# Patient Record
Sex: Female | Born: 1955 | Race: Black or African American | Hispanic: No | Marital: Single | Smoking: Former smoker
Health system: Southern US, Community
[De-identification: ages and names within clinical notes are randomized; demographics above are authoritative.]

## PROBLEM LIST (undated history)

## (undated) DIAGNOSIS — E079 Disorder of thyroid, unspecified: Secondary | ICD-10-CM

## (undated) DIAGNOSIS — E039 Hypothyroidism, unspecified: Secondary | ICD-10-CM

## (undated) DIAGNOSIS — E785 Hyperlipidemia, unspecified: Secondary | ICD-10-CM

## (undated) DIAGNOSIS — I639 Cerebral infarction, unspecified: Secondary | ICD-10-CM

## (undated) DIAGNOSIS — I10 Essential (primary) hypertension: Secondary | ICD-10-CM

## (undated) HISTORY — PX: APPENDECTOMY: SHX54

---

## 2011-04-15 ENCOUNTER — Other Ambulatory Visit (HOSPITAL_COMMUNITY): Payer: Self-pay | Admitting: Endocrinology

## 2011-04-15 DIAGNOSIS — E0590 Thyrotoxicosis, unspecified without thyrotoxic crisis or storm: Secondary | ICD-10-CM

## 2011-04-24 ENCOUNTER — Ambulatory Visit (HOSPITAL_COMMUNITY)
Admission: RE | Admit: 2011-04-24 | Discharge: 2011-04-24 | Disposition: A | Discharge status: Home / Self Care | Payer: Medicaid Other | Source: Ambulatory Visit | Attending: Endocrinology | Admitting: Endocrinology

## 2011-04-24 DIAGNOSIS — E0590 Thyrotoxicosis, unspecified without thyrotoxic crisis or storm: Secondary | ICD-10-CM

## 2011-04-24 MED ORDER — SODIUM IODIDE I 131 CAPSULE
42.0000 | Freq: Once | INTRAVENOUS | Status: AC | PRN
  Administered 2011-04-24: 42 via ORAL

## 2015-07-17 ENCOUNTER — Emergency Department (HOSPITAL_COMMUNITY)
Admission: EM | Admit: 2015-07-17 | Discharge: 2015-07-17 | Disposition: A | Discharge status: Home / Self Care | Payer: Medicare PPO | Attending: Emergency Medicine | Admitting: Emergency Medicine

## 2015-07-17 ENCOUNTER — Encounter (HOSPITAL_COMMUNITY): Payer: Self-pay | Admitting: Family Medicine

## 2015-07-17 ENCOUNTER — Emergency Department (HOSPITAL_COMMUNITY): Payer: Medicare PPO

## 2015-07-17 DIAGNOSIS — R51 Headache: Secondary | ICD-10-CM | POA: Insufficient documentation

## 2015-07-17 DIAGNOSIS — I69398 Other sequelae of cerebral infarction: Secondary | ICD-10-CM | POA: Insufficient documentation

## 2015-07-17 DIAGNOSIS — I10 Essential (primary) hypertension: Secondary | ICD-10-CM | POA: Insufficient documentation

## 2015-07-17 DIAGNOSIS — Z79899 Other long term (current) drug therapy: Secondary | ICD-10-CM | POA: Insufficient documentation

## 2015-07-17 DIAGNOSIS — R4701 Aphasia: Secondary | ICD-10-CM | POA: Diagnosis not present

## 2015-07-17 DIAGNOSIS — Z7901 Long term (current) use of anticoagulants: Secondary | ICD-10-CM | POA: Insufficient documentation

## 2015-07-17 DIAGNOSIS — R519 Headache, unspecified: Secondary | ICD-10-CM

## 2015-07-17 DIAGNOSIS — R200 Anesthesia of skin: Secondary | ICD-10-CM | POA: Diagnosis not present

## 2015-07-17 DIAGNOSIS — M791 Myalgia: Secondary | ICD-10-CM | POA: Insufficient documentation

## 2015-07-17 DIAGNOSIS — M25512 Pain in left shoulder: Secondary | ICD-10-CM | POA: Insufficient documentation

## 2015-07-17 DIAGNOSIS — E079 Disorder of thyroid, unspecified: Secondary | ICD-10-CM | POA: Insufficient documentation

## 2015-07-17 DIAGNOSIS — M25511 Pain in right shoulder: Secondary | ICD-10-CM | POA: Insufficient documentation

## 2015-07-17 DIAGNOSIS — M542 Cervicalgia: Secondary | ICD-10-CM | POA: Insufficient documentation

## 2015-07-17 DIAGNOSIS — R531 Weakness: Secondary | ICD-10-CM | POA: Insufficient documentation

## 2015-07-17 DIAGNOSIS — Z87891 Personal history of nicotine dependence: Secondary | ICD-10-CM | POA: Diagnosis not present

## 2015-07-17 HISTORY — DX: Essential (primary) hypertension: I10

## 2015-07-17 HISTORY — DX: Cerebral infarction, unspecified: I63.9

## 2015-07-17 HISTORY — DX: Disorder of thyroid, unspecified: E07.9

## 2015-07-17 LAB — CBC WITH DIFFERENTIAL/PLATELET
BASOS ABS: 0 10*3/uL (ref 0.0–0.1)
Basophils Relative: 1 %
EOS ABS: 0 10*3/uL (ref 0.0–0.7)
Eosinophils Relative: 0 %
HCT: 37.7 % (ref 36.0–46.0)
HEMOGLOBIN: 12.3 g/dL (ref 12.0–15.0)
LYMPHS ABS: 1.9 10*3/uL (ref 0.7–4.0)
LYMPHS PCT: 41 %
MCH: 31.3 pg (ref 26.0–34.0)
MCHC: 32.6 g/dL (ref 30.0–36.0)
MCV: 95.9 fL (ref 78.0–100.0)
Monocytes Absolute: 0.4 10*3/uL (ref 0.1–1.0)
Monocytes Relative: 9 %
NEUTROS PCT: 49 %
Neutro Abs: 2.4 10*3/uL (ref 1.7–7.7)
PLATELETS: 185 10*3/uL (ref 150–400)
RBC: 3.93 MIL/uL (ref 3.87–5.11)
RDW: 13.2 % (ref 11.5–15.5)
WBC: 4.7 10*3/uL (ref 4.0–10.5)

## 2015-07-17 LAB — COMPREHENSIVE METABOLIC PANEL
ALT: 21 U/L (ref 14–54)
AST: 23 U/L (ref 15–41)
Albumin: 3.9 g/dL (ref 3.5–5.0)
Alkaline Phosphatase: 66 U/L (ref 38–126)
Anion gap: 12 (ref 5–15)
BUN: 6 mg/dL (ref 6–20)
CHLORIDE: 110 mmol/L (ref 101–111)
CO2: 22 mmol/L (ref 22–32)
Calcium: 9.7 mg/dL (ref 8.9–10.3)
Creatinine, Ser: 0.65 mg/dL (ref 0.44–1.00)
Glucose, Bld: 93 mg/dL (ref 65–99)
POTASSIUM: 3.7 mmol/L (ref 3.5–5.1)
SODIUM: 144 mmol/L (ref 135–145)
Total Bilirubin: 0.6 mg/dL (ref 0.3–1.2)
Total Protein: 7.2 g/dL (ref 6.5–8.1)

## 2015-07-17 LAB — PROTIME-INR
INR: 1.11 (ref 0.00–1.49)
PROTHROMBIN TIME: 14.5 s (ref 11.6–15.2)

## 2015-07-17 MED ORDER — METOCLOPRAMIDE HCL 5 MG/ML IJ SOLN
10.0000 mg | Freq: Once | INTRAMUSCULAR | Status: AC
  Administered 2015-07-17: 10 mg via INTRAVENOUS
  Filled 2015-07-17: qty 2

## 2015-07-17 MED ORDER — DIPHENHYDRAMINE HCL 50 MG/ML IJ SOLN
25.0000 mg | Freq: Once | INTRAMUSCULAR | Status: AC
  Administered 2015-07-17: 25 mg via INTRAVENOUS
  Filled 2015-07-17: qty 1

## 2015-07-17 NOTE — ED Notes (Signed)
Pt here for right sided headache x 3 days and pain down her entire right side. Denies fall or injury.

## 2015-07-17 NOTE — ED Notes (Signed)
EDP at bedside  

## 2015-07-17 NOTE — Discharge Instructions (Signed)
Today you were seen for a headache and right sided numbness.  As discussed, we did not see evidence of a new stroke on your Head CT which was obtained today. Your symptoms may be due to increased stress or from infection which you had last week as these can sometimes worsen you stroke symptoms. HOME CARE INSTRUCTIONS  Watch your condition for any changes. Take these steps to help with your condition: Managing Pain  Take over-the-counter and prescription medicines - Take Tylenol or your prescription pain medicine as needed. Avoid Ibuprofen or Naproxen.   Lie down in a dark, quiet room when you have a headache.  Keep lights dim if bright lights bother you or make your headaches worse.  Eating and Drinking  Eat meals on a regular schedule.  Limit alcohol use.  Decrease the amount of caffeine you drink, or stop drinking caffeine.  General Instructions  Please follow up with your primary care doctor or neurologist.  Keep a headache journal to help find out what may trigger your headaches. For example, write down:  What you eat and drink.  How much sleep you get.  Any change to your diet or medicines.  Try massage or other relaxation techniques.  Limit stress.  Sit up straight, and do not tense your muscles.  Do not use tobacco products, including cigarettes, chewing tobacco, or e-cigarettes.   Exercise regularly as told by your health care provider. Continue going to your PT appointments  Sleep on a regular schedule. Get 7-9 hours of sleep, or the amount recommended by your health care provider.  SEEK IMMEDIATE MEDICAL CARE IF:   Your headache becomes severe.  You have repeated vomiting.  You have a stiff neck.  You have a loss of vision.  You have problems with speech.  You have pain in the eye or ear.  You have muscular weakness or loss of muscle control.  You lose your balance or have trouble walking.  You feel faint or pass out.  You have confusion.     This information is not intended to replace advice given to you by your health care provider. Make sure you discuss any questions you have with your health care provider.   Document Released: 04/20/2005 Document Revised: 01/09/2015 Document Reviewed: 08/13/2014 Elsevier Interactive Patient Education [employer]2016 Elsevier Inc.

## 2015-07-17 NOTE — ED Provider Notes (Signed)
CSN: [id number]     Arrival date & time 07/17/15  0941 History   First MD Initiated Contact with Patient 07/17/15 1248     Chief Complaint  Patient presents with  . Headache    HPI Comments: 60 year old female presents with a headache for the past 3 days along with numbness which radiates down the right side of her body. 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 Denies history of HA. She reports her headache is worst in the back and radiates to the front of her head. Describes it as a dull ache. 6/10 on the pain scale. She has been taking Tylenol with no relief. She reports the numbness starts on the side of her face and extends down to the right leg. She has residual right sided weakness but thinks it may be worse. Denies fever, chills, visual disturbances, sudden loss of vision, chest pain, SOB, abdominal pain, dizziness, inability to walk. She had a URI last week but has since recovered. She reports the the numbness is new however when looking in CareEverywhere it states she has been seen for numbness before and it manifests more often during times of stress/infection.  The history is provided by the patient and a significant other (boyfriend).    Past Medical History  Diagnosis Date  . Stroke (HCC)   . Hypertension   . Thyroid disease    Past Surgical History  Procedure Laterality Date  . Appendectomy     History reviewed. No pertinent family history. Social History  Substance Use Topics  . Smoking status: Former Games developer  . Smokeless tobacco: None  . Alcohol Use: None   OB History    No data available     Review of Systems  Constitutional: Negative for fever and chills.  HENT: Negative for sinus pressure.   Eyes: Negative for visual disturbance.       No sudden loss of vision, aura, floaters  Respiratory: Negative for cough and shortness of breath.   Cardiovascular: Negative for chest pain.  Gastrointestinal:  Negative for abdominal pain.  Musculoskeletal: Positive for myalgias and neck pain.  Skin: Negative for color change and rash.  Neurological: Positive for weakness, numbness and headaches. Negative for dizziness and syncope.       Chronic R sided weakness  Psychiatric/Behavioral: Negative for confusion and decreased concentration.    Allergies  Review of patient's allergies indicates no known allergies.  Home Medications   Prior to Admission medications   Medication Sig Start Date End Date Taking? Authorizing Provider  amLODipine (NORVASC) 5 MG tablet Take 5 mg by mouth daily. 06/17/15  Yes Historical Provider, MD  atorvastatin (LIPITOR) 20 MG tablet Take 20 mg by mouth daily. 07/05/15  Yes Historical Provider, MD  baclofen (LIORESAL) 10 MG tablet Take 5 mg by mouth 2 (two) times daily. Reported on 07/17/2015 05/10/15  Yes Historical Provider, MD  clopidogrel (PLAVIX) 75 MG tablet Take 75 mg by mouth daily. 06/27/15  Yes Historical Provider, MD  HYDROcodone-acetaminophen (NORCO/VICODIN) 5-325 MG tablet Take 1 tablet by mouth every 6 (six) hours as needed for moderate pain.  06/24/15  Yes Historical Provider, MD  levothyroxine (SYNTHROID, LEVOTHROID) 125 MCG tablet Take 125 mcg by mouth daily. 06/19/15  Yes Historical Provider, MD  lisinopril (PRINIVIL,ZESTRIL) 10 MG tablet Take 10 mg by mouth daily. 04/19/15  Yes Historical Provider, MD  Magnesium 250 MG TABS Take 250 mg by  mouth daily.    Yes Historical Provider, MD  Multiple Vitamins-Minerals (CENTRUM SILVER ADULT 50+ PO) Take 1 tablet by mouth daily.   Yes Historical Provider, MD   BP 129/76 mmHg  Pulse 72  Temp(Src) 99.1 F (37.3 C) (Oral)  Resp 18  SpO2 100%   Physical Exam  Constitutional: She appears well-developed and well-nourished. No distress.  HENT:  Head: Normocephalic and atraumatic.  Eyes: Pupils are equal, round, and reactive to light.  Cardiovascular: Normal rate and regular rhythm.  Exam reveals no gallop and no friction  rub.   No murmur heard. Pulmonary/Chest: Effort normal. No respiratory distress. She has no wheezes. She has no rales. She exhibits no tenderness.  Musculoskeletal:  Tenderness to palpation of the occipital lobe. Tenderness of the R and L trapezius muscles   Neurological:  Mental Status:  Alert, oriented, thought content appropriate, able to give a coherent history. Minimal aphasia. Able to follow 2 step commands without difficulty.  Cranial Nerves:  II:  Peripheral visual fields grossly normal, pupils equal, round, reactive to light III,IV, VI: ptosis not present, extra-ocular motions intact bilaterally  V,VII: smile symmetric, facial light touch sensation equal VIII: hearing grossly normal to voice  X: uvula elevates symmetrically  XI: bilateral shoulder shrug symmetric and strong XII: midline tongue extension without fassiculations Motor:  Normal tone. 3/5 strength in upper and lower extremities on the right side and 5/5 strength in upper and lower extremities on the left side. Strong and equal grip strength. Decreased dorsiflexion/plantar flexion on the right side when compared to the left. Sensory: Sensation to light touch decreased on the right upper extremity vs left upper extremity. No change in sensation in the lower extremities.  Cerebellar: normal finger-to-nose with bilateral upper extremities Gait: Unsteady gait. Walks with cane at baseline   Skin: Skin is warm and dry.  Psychiatric: She has a normal mood and affect.    ED Course  Procedures (including critical care time) Labs Review Labs Reviewed  CBC WITH DIFFERENTIAL/PLATELET  COMPREHENSIVE METABOLIC PANEL  PROTIME-INR    Imaging Review Ct Head Wo Contrast  07/17/2015  CLINICAL DATA:  Posterior frontal headache for 3 days. Right arm numbness for 1 day. No known injury. Initial encounter. EXAM: CT HEAD WITHOUT CONTRAST TECHNIQUE: Contiguous axial images were obtained from the base of the skull through the vertex  without intravenous contrast. COMPARISON:  Head CT scan 05/03/2015. FINDINGS: There is cortical atrophy and chronic microvascular ischemic change. Remote deep white matter infarct on the left is unchanged. No evidence of acute intracranial abnormality including hemorrhage, infarct, mass lesion, mass effect, midline shift or abnormal extra-axial fluid collection is seen. No hydrocephalus or pneumocephalus. The calvarium is intact. Imaged paranasal sinuses and mastoid air cells are clear. IMPRESSION: No acute abnormality.  Stable compared to prior exam. Electronically Signed   By: Drusilla Kannerhomas  Dalessio M.D.   On: 07/17/2015 14:06   I have personally reviewed and evaluated these images and lab results as part of my medical decision-making.   EKG Interpretation   Date/Time:  Wednesday July 17 2015 14:41:07 EDT Ventricular Rate:  65 PR Interval:  173 QRS Duration: 80 QT Interval:  443 QTC Calculation: 461 R Axis:   46 Text Interpretation:  Sinus rhythm Low voltage, precordial leads Baseline  wander in lead(s) V6 No previous tracing Confirmed by Anitra LauthPLUNKETT  MD,  Alphonzo LemmingsWHITNEY ([id number]) on 07/17/2015 2:48:36 PM      Labs reviewed and were unremarkable. Headache cocktail given. Pt improves some improvement.  MDM   Final diagnoses:  Nonintractable headache, unspecified chronicity pattern, unspecified headache type   60 year old female with previous stroke coming in with headache and increasing numbness and weakness. Her Heat CT was neg for acute processes. Her labs were unremarkable. VS normal. EKG was NSR. It is unlikely that she is experiencing a repeat CVA or SAH. She was seen by her neurologist in January who commented that her numbness and weakness comes and goes during times of stress. It is most likely this is what happened due to the fact that she was recently ill and on antibiotics. Advised pt to treat her pain symptomatically with her prescription pain meds or Tylenol and to continue doing PT as  tolerated. Avoid NSAIDs. Return to the ED if pain or numbness is getting worse or she starts to develop additional focal neuro deficits. Patient informed of clinical course, understands medical decision-making process, and agrees with plan. All questions answered.  Patient seen in conjunction with Dr. Lewis Moccasin Kipp Brood, PA-C 07/17/15 1547  Gwyneth Sprout, MD 07/18/15 [phone]

## 2017-06-21 ENCOUNTER — Ambulatory Visit: Payer: Medicare PPO | Admitting: Physical Therapy

## 2017-06-21 ENCOUNTER — Other Ambulatory Visit: Payer: Self-pay

## 2017-06-28 NOTE — Therapy (Signed)
[facility] [place] [address]  [address] [place], [place], [address] Phone: [phone]   Fax:  [phone]  Patient Details  Name: Kristin Mahoney MRN: [id number] Date of Birth: 09/26/1955 Referring Provider:  Dennard SchaumannParuchuri, Vamsee, MD  Encounter Date: 06/21/2017  Patient presenting to OPPT on 06/21/17. Subjective reports of being referred to PT for fitting and trial of bioness device, of which is patients greatest concern. This facility is not equipped to handle this request, therefore patient set-up with [facility].   Thanks!   Kipp LaurenceStephanie R Aaron, PT, DPT 06/28/17 11:34 AM   [facility] [place] [address]  [address] [place], [place], [address] Phone: [phone]   Fax:  [phone]

## 2017-07-02 ENCOUNTER — Encounter: Payer: Self-pay | Admitting: Physical Therapy

## 2017-07-02 ENCOUNTER — Other Ambulatory Visit: Payer: Self-pay

## 2017-07-02 ENCOUNTER — Ambulatory Visit: Payer: Medicare HMO | Attending: Internal Medicine | Admitting: Physical Therapy

## 2017-07-02 DIAGNOSIS — I69351 Hemiplegia and hemiparesis following cerebral infarction affecting right dominant side: Secondary | ICD-10-CM | POA: Diagnosis present

## 2017-07-02 DIAGNOSIS — R2689 Other abnormalities of gait and mobility: Secondary | ICD-10-CM | POA: Insufficient documentation

## 2017-07-02 DIAGNOSIS — R2681 Unsteadiness on feet: Secondary | ICD-10-CM | POA: Insufficient documentation

## 2017-07-02 DIAGNOSIS — R208 Other disturbances of skin sensation: Secondary | ICD-10-CM | POA: Insufficient documentation

## 2017-07-02 DIAGNOSIS — M21371 Foot drop, right foot: Secondary | ICD-10-CM

## 2017-07-02 NOTE — Therapy (Signed)
[facility] [address] [place], [place], [address] Phone: [phone]   Fax:  [phone]  Physical Therapy Evaluation  Patient Details  Name: Kristin Mahoney MRN: [id number] Date of Birth: 03/11/1956 Referring Provider: Dennard Schaumann, MD   Encounter Date: 07/02/2017  PT End of Session - 07/02/17 2056    Visit Number  1    Number of Visits  17    Date for PT Re-Evaluation  08/31/17    Authorization Type  [employer] HMO    PT Start Time  [phone]    PT Stop Time  1015    PT Time Calculation (min)  46 min    Equipment Utilized During Treatment  Other (comment) bioness    Activity Tolerance  Patient tolerated treatment well    Behavior During Therapy  [facility] for tasks assessed/performed       Past Medical History:  Diagnosis Date  . Hypertension   . Stroke (HCC)   . Thyroid disease     Past Surgical History:  Procedure Laterality Date  . APPENDECTOMY      There were no vitals filed for this visit.   Subjective Assessment - 07/02/17 0934    Subjective  Pt presents to OPPT for evaluation of Bioness functional electrical stimulation for her RLE; pt's CVA was about 11 years ago.  Pt has been using a cane ever since but her goal is to be able to walk without the cane.  Pt heard about the Bioness through a magazine and would like to use it to improve her walking.    Patient is accompained by:  Family member    Pertinent History  HTN, CVA, thyroid disease    Limitations  Walking    Patient Stated Goals  Use the Bioness for her RLE and stop using the cane.    Currently in Pain?  Yes    Pain Score  2     Pain Location  Knee    Pain Orientation  Right    Pain Descriptors / Indicators  Aching    Pain Type  Chronic pain    Pain Relieving Factors  pt reported resolution of pain in R knee after use of Bioness         [facility] PT Assessment - 07/02/17 0940      Assessment   Medical Diagnosis  R hemiparesis     Referring Provider  Dennard Schaumann, MD    Onset Date/Surgical Date  06/10/17 date of referral    Prior Therapy  yes after CVA; orthopedic for shoulder pain      Precautions   Precautions  Other (comment)    Precaution Comments  HTN, CVA, thyroid disease      Balance Screen   Has the patient fallen in the past 6 months  No    Has the patient had a decrease in activity level because of a fear of falling?   No    Is the patient reluctant to leave their home because of a fear of falling?   No      Home Environment   Living Environment  Private residence    Living Arrangements  Spouse/significant other    Type of Home  House    Home Access  Stairs to enter    Entrance Stairs-Number of Steps  4    Entrance Stairs-Rails  Right    Home Layout  One level    Home Equipment  Ray - single point [employer]  Additional Comments  had a KAFO then plastic AFO then hinged AFO as pt progressed; does not currently wear an AFO      Prior Function   Level of Independence  Requires assistive device for independence;Independent with community mobility with device;Independent with household mobility with device;Independent with basic ADLs    Leisure  exercises at [employer]      Observation/Other Assessments   Focus on Therapeutic Outcomes (FOTO)   not indicated; CVA chronic      Sensation   Light Touch  Impaired Detail    Light Touch Impaired Details  Impaired RLE      ROM / Strength   AROM / PROM / Strength  Strength      Strength   Overall Strength  Deficits    Overall Strength Comments  LLE: 5/5; RLE: 4-/5 except ankle DF 3+/5      Ambulation/Gait   Ambulation/Gait  Yes    Ambulation/Gait Assistance  6: Modified independent (Device/Increase time)    Ambulation Distance (Feet)  230 Feet    Assistive device  Straight cane    Gait Pattern  Decreased arm swing - right;Decreased stride length;Decreased hip/knee flexion - right;Decreased dorsiflexion - right;Right hip hike;Right  genu recurvatum;Decreased trunk rotation;Wide base of support hips in ER    Ambulation Surface  Level;Indoor    Stairs  Yes    Stairs Assistance  6: Modified independent (Device/Increase time)    Stair Management Technique  One rail Right;Alternating pattern;Forwards;With cane    Number of Stairs  4    Height of Stairs  6    Gait Comments  With use of Bioness lower cuff pt demonstrated improved ankle DF in swing phase with improved clearance and heel strike over ground and improved foot clearance during stair negotiation.  Pt would benefit from use of thigh cuff for hamstring strengthening and improved motor planning/sequencing during gait and to increase knee control during stance phase      Standardized Balance Assessment   Standardized Balance Assessment  10 meter walk test    10 Meter Walk  16.9 seconds with cane and without Bioness or 1.94 ft/sec; with Bioness lower cuff for ankle DF: 15.68 seconds with cane or 2.1 ft/sec             Objective measurements completed on examination: See above findings.      [facility] Adult PT Treatment/Exercise - 07/02/17 0940      Modalities   Modalities  Electrical Stimulation      Electrical Stimulation   Electrical Stimulation Location  R anterior tibialis    Electrical Stimulation Action  ankle DF open and closed chain    Electrical Stimulation Parameters  see saved parameters in Tablet 1; quick fit electrode    Electrical Stimulation Goals  Strength;Neuromuscular facilitation             PT Education - 07/02/17 2055    Education provided  Yes    Education Details  use of Bioness for ankle DF and hamstring strengthening, PT clinical findings, POC and goals    Person(s) Educated  Patient;Spouse    Methods  Explanation    Comprehension  Verbalized understanding       PT Short Term Goals - 07/02/17 2107      PT SHORT TERM GOAL #1   Title  Pt will participate in further assessment of balance and gait impairments with BERG and  DGI    Baseline  Not assessed to date  Time  4    Period  Weeks    Status  New    Target Date  08/01/17      PT SHORT TERM GOAL #2   Title  Pt will demonstrate increased gait speed to >/= 2.3 ft/sec with cane and Bioness    Baseline  1.9 ft/sec without Bioness; 2.1 with Bioness    Time  4    Period  Weeks    Status  New    Target Date  08/01/17      PT SHORT TERM GOAL #3   Title  Pt will ambulate 350' over indoor surfaces and will negotiate 4 stairs with one rail, alternating sequence MOD I with cane and Bioness with improved R foot clearance, heel strike at initial contact, and decreased recurvatum during stance    Baseline  R genu recurvatum, decreased R hip and knee flexion during swing, foot flat at initial contact    Time  4    Period  Weeks    Status  New    Target Date  08/01/17      PT SHORT TERM GOAL #4   Title  Will initiate and submit paperwork for home Bioness unit    Baseline  no paperwork submitted to date    Time  4    Period  Weeks    Status  New    Target Date  08/01/17        PT Long Term Goals - 07/02/17 2115      PT LONG TERM GOAL #1   Title  Pt will demonstrate independence with LE strengthening and balance HEP    Baseline  no HEP to date    Time  8    Period  Weeks    Status  New    Target Date  08/31/17      PT LONG TERM GOAL #2   Title  If approved for home Bioness unit; pt will demonstrate independence with donning, care, skin check and wear schedule    Baseline  dependent to date    Time  8    Period  Weeks    Status  New    Target Date  08/31/17      PT LONG TERM GOAL #3   Title  Pt will improve gait velocity to >/= 2.62 ft/sec to improve safety as community ambulator    Baseline  1.91 ft/sec with cane    Time  8    Period  Weeks    Status  New    Target Date  08/31/17      PT LONG TERM GOAL #4   Title  Pt will improve DGI by 4 points to decrease falls risk    Baseline  Not assessed to date    Time  8    Period  Weeks     Status  New    Target Date  08/31/17      PT LONG TERM GOAL #5   Title  Pt will improve BERG score by 8 points to decrease falls risk    Baseline  not assessed to date    Time  8    Period  Weeks    Status  New    Target Date  08/31/17      Additional Long Term Goals   Additional Long Term Goals  Yes      PT LONG TERM GOAL #6   Title  Pt will ambulate >200' over indoor  surfaces without cane; 250' over outdoor paved surfaces with cane; negotiate 4 stairs with one rail, alternating sequence, NO cane at MOD I with improved RLE mechanics    Baseline  R genu recurvatum, decreased R hip and knee flexion during swing, foot flat at initial contact    Time  8    Period  Weeks    Status  New    Target Date  08/31/17             Plan - 07/02/17 2057    Clinical Impression Statement  Pt is a 62 year old female referred to Neuro OPPT for evaluation of use of Bioness functional electrical stimulation for chronic R hemiparesis.  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  [facility] and impingement. The following deficits were noted during pt's exam: chronic R hemiparesis with impaired strength, sensation, impaired motor planning/sequencing, impaired gait and impaired balance.  Pt's gait speed indicates pt is safe for limited community ambulation but is below normal limits for independent community dwelling adult.  Pt demonstrated improvement in gait velocity to >2.1 ft/sec with use of Bioness functional electrical stimulation of R anterior tibialis.  Pt would benefit from skilled PT to address these impairments and functional limitations to maximize functional mobility independence and reduce falls risk.    History and Personal Factors relevant to plan of care:  chronic R hemiparesis, CVA x 2, HTN, thyroid disease, unable to utilize AFO,  independent ambulator with cane    Clinical Presentation  Stable    Clinical Presentation due to:  chronic R hemiparesis, CVA x 2, HTN, thyroid disease, unable to utilize AFO, independent ambulator with cane    Clinical Decision Making  Low    Rehab Potential  Good    PT Frequency  2x / week    PT Duration  8 weeks    PT Treatment/Interventions  ADLs/Self Care Home Management;Electrical Stimulation;DME Instruction;Gait training;Stair training;Functional mobility training;Therapeutic activities;Therapeutic exercise;Balance training;Neuromuscular re-education;Patient/family education;Passive range of motion    PT Next Visit Plan  continue use of Bioness with addition of hamstring; assess DGI and BERG and revise goals.  RLE NMR.  Bioness with treadmill    Consulted and Agree with Plan of Care  Patient;Family member/caregiver    Family Member Consulted  significant other       Patient will benefit from skilled therapeutic intervention in order to improve the following deficits and impairments:  Abnormal gait, Decreased balance, Decreased strength, Difficulty walking, Impaired sensation, Pain, Impaired tone  Visit Diagnosis: Hemiplegia and hemiparesis following cerebral infarction affecting right dominant side (HCC) - Plan: PT plan of care cert/re-cert  Foot drop, right - Plan: PT plan of care cert/re-cert  Other abnormalities of gait and mobility - Plan: PT plan of care cert/re-cert  Other disturbances of skin sensation - Plan: PT plan of care cert/re-cert  Unsteadiness on feet - Plan: PT plan of care cert/re-cert     Problem List There are no active problems to display for this patient.  Dierdre HighmanAudra F Alissandra Geoffroy, PT, DPT 07/02/17    9:29 PM     [facility] [address] [place], [place], [address] Phone: [phone]   Fax:  [phone]  Name: Kristin KaiserCatherine Pusey MRN: [id number] Date of Birth: 10/27/1955

## 2017-07-16 ENCOUNTER — Ambulatory Visit: Payer: Medicare HMO | Admitting: Physical Therapy

## 2017-07-19 ENCOUNTER — Ambulatory Visit: Payer: Medicare HMO | Admitting: Physical Therapy

## 2017-07-19 ENCOUNTER — Encounter: Payer: Self-pay | Admitting: Physical Therapy

## 2017-07-19 DIAGNOSIS — I69351 Hemiplegia and hemiparesis following cerebral infarction affecting right dominant side: Secondary | ICD-10-CM | POA: Diagnosis not present

## 2017-07-19 DIAGNOSIS — M21371 Foot drop, right foot: Secondary | ICD-10-CM

## 2017-07-19 DIAGNOSIS — R2689 Other abnormalities of gait and mobility: Secondary | ICD-10-CM

## 2017-07-19 DIAGNOSIS — R208 Other disturbances of skin sensation: Secondary | ICD-10-CM

## 2017-07-19 DIAGNOSIS — R2681 Unsteadiness on feet: Secondary | ICD-10-CM

## 2017-07-19 NOTE — Therapy (Signed)
[facility] [address] [place], [place], [address] Phone: [phone]   Fax:  [phone]  Physical Therapy Treatment  Patient Details  Name: Kristin KaiserCatherine Mccuiston MRN: [id number] Date of Birth: 09/13/1955 Referring Provider: Dennard SchaumannVamsee Paruchuri, MD   Encounter Date: 07/19/2017  PT End of Session - 07/19/17 1539    Visit Number  2    Number of Visits  17    Date for PT Re-Evaluation  08/31/17    Authorization Type  [employer] H[employer]    PT Start Time  1448    PT Stop Time  1533    PT Time Calculation (min)  45 min    Equipment Utilized During Treatment  Other (comment) bioness    Activity Tolerance  Patient tolerated treatment well    Behavior During Therapy  [facility] for tasks assessed/performed       Past Medical History:  Diagnosis Date  . Hypertension   . Stroke (HCC)   . Thyroid disease     Past Surgical History:  Procedure Laterality Date  . APPENDECTOMY      There were no vitals filed for this visit.  Subjective Assessment - 07/19/17 1454    Subjective  Had a severe sinus infection and is currently taking steroids and antibiotics.  Feeling better but fatigued today.  Did not know about appointment on Friday.    Patient is accompained by:  Family member    Pertinent History  HTN, CVA, thyroid disease    Limitations  Walking    Patient Stated Goals  Use the Bioness for her RLE and stop using the cane.    Currently in Pain?  No/denies         [facility] PT Assessment - 07/19/17 1513      Standardized Balance Assessment   Standardized Balance Assessment  Dynamic Gait Index      Dynamic Gait Index   Level Surface  Moderate Impairment    Change in Gait Speed  Severe Impairment    Gait with Horizontal Head Turns  Mild Impairment    Gait with Vertical Head Turns  Mild Impairment    Gait and Pivot Turn  Mild Impairment    Step Over Obstacle  Severe Impairment    Step Around Obstacles  Mild Impairment    Steps  Mild Impairment    Total Score  11    DGI comment:  11/24                  [facility] Adult PT Treatment/Exercise - 07/19/17 1535      Ambulation/Gait   Ambulation/Gait  Yes    Ambulation/Gait Assistance  6: Modified independent (Device/Increase time)    Ambulation Distance (Feet)  350 Feet    Assistive device  Straight cane    Gait Pattern  Step-through pattern R foot slap    Ambulation Surface  Level;Indoor    Gait Comments  Addition of hamstring stimulation today during gait; repeated various DGI tasks with Bioness with pt demonstrating improvement in knee flexion, step and stride length and foot clearance during swing, improved clearance over obstacles and improved ability to change to faster walking speed      Modalities   Modalities  Primary school teacherlectrical Stimulation      Electrical Stimulation   Electrical Stimulation Location  R anterior tibialis and R hamstring muscles    Electrical Stimulation Action  open and closed chain ankle DF, hip extension + knee flexion    Electrical Stimulation Parameters  See saved parameters in Tablet 1; may require steering electrode next session    Electrical Stimulation Goals  Strength;Neuromuscular facilitation             PT Education - 07/19/17 1539    Education provided  Yes    Education Details  DGI outcomes; Bioness on hamstring mm    Person(s) Educated  Patient    Methods  Explanation    Comprehension  Verbalized understanding       PT Short Term Goals - 07/02/17 2107      PT SHORT TERM GOAL #1   Title  Pt will participate in further assessment of balance and gait impairments with BERG and DGI    Baseline  Not assessed to date    Time  4    Period  Weeks    Status  New    Target Date  08/01/17      PT SHORT TERM GOAL #2   Title  Pt will demonstrate increased gait speed to >/= 2.3 ft/sec with cane and Bioness    Baseline  1.9 ft/sec without Bioness; 2.1 with Bioness    Time  4    Period  Weeks    Status  New     Target Date  08/01/17      PT SHORT TERM GOAL #3   Title  Pt will ambulate 350' over indoor surfaces and will negotiate 4 stairs with one rail, alternating sequence MOD I with cane and Bioness with improved R foot clearance, heel strike at initial contact, and decreased recurvatum during stance    Baseline  R genu recurvatum, decreased R hip and knee flexion during swing, foot flat at initial contact    Time  4    Period  Weeks    Status  New    Target Date  08/01/17      PT SHORT TERM GOAL #4   Title  Will initiate and submit paperwork for home Bioness unit    Baseline  no paperwork submitted to date    Time  4    Period  Weeks    Status  New    Target Date  08/01/17        PT Long Term Goals - 07/19/17 1701      PT LONG TERM GOAL #1   Title  Pt will demonstrate independence with LE strengthening and balance HEP    Baseline  no HEP to date    Time  8    Period  Weeks    Status  New    Target Date  08/31/17      PT LONG TERM GOAL #2   Title  If approved for home Bioness unit; pt will demonstrate independence with donning, care, skin check and wear schedule    Baseline  dependent to date    Time  8    Period  Weeks    Status  New    Target Date  08/31/17      PT LONG TERM GOAL #3   Title  Pt will improve gait velocity to >/= 2.62 ft/sec to improve safety as community ambulator    Baseline  1.91 ft/sec with cane    Time  8    Period  Weeks    Status  New    Target Date  08/31/17      PT LONG TERM GOAL #4   Title  Pt will improve DGI by 4 points to decrease falls risk  Baseline  11/24    Time  8    Period  Weeks    Status  Revised    Target Date  08/31/17      PT LONG TERM GOAL #5   Title  Pt will improve BERG score by 8 points to decrease falls risk    Baseline  not assessed to date    Time  8    Period  Weeks    Status  New    Target Date  08/31/17      PT LONG TERM GOAL #6   Title  Pt will ambulate >200' over indoor surfaces without cane; 250' over  outdoor paved surfaces with cane; negotiate 4 stairs with one rail, alternating sequence, NO cane at MOD I with improved RLE mechanics    Baseline  R genu recurvatum, decreased R hip and knee flexion during swing, foot flat at initial contact    Time  8    Period  Weeks    Status  New    Target Date  08/31/17            Plan - 07/19/17 1531    Clinical Impression Statement  Treatment session focused on assessment of falls risk and safety of gait with DGI and set up of Bioness for RLE hamstring and ankle DF training during gait on level surfaces and during negotiation of variety of obstacles.  Pt demonstrates high falls risk when ambulating in community environment and during more challenging gait tasks as indicated by DGI score of 11/24.  Slight improvement in safety with these tasks with use of Bioness.  Will continue to assess and address balance and RLE NMR to progress towards LTG.    Rehab Potential  Good    PT Frequency  2x / week    PT Duration  8 weeks    PT Treatment/Interventions  ADLs/Self Care Home Management;Electrical Stimulation;DME Instruction;Gait training;Stair training;Functional mobility training;Therapeutic activities;Therapeutic exercise;Balance training;Neuromuscular re-education;Patient/family education;Passive range of motion    PT Next Visit Plan  Bioness - may need steering electrode; stairs with Bioness; assess BERG and revise goals.  RLE NMR.  Bioness with treadmill    Consulted and Agree with Plan of Care  Patient;Family member/caregiver    Family Member Consulted  significant other       Patient will benefit from skilled therapeutic intervention in order to improve the following deficits and impairments:  Abnormal gait, Decreased balance, Decreased strength, Difficulty walking, Impaired sensation, Pain, Impaired tone  Visit Diagnosis: Hemiplegia and hemiparesis following cerebral infarction affecting right dominant side (HCC)  Foot drop, right  Other  abnormalities of gait and mobility  Other disturbances of skin sensation  Unsteadiness on feet     Problem List There are no active problems to display for this patient.   Dierdre Highman, PT, DPT 07/19/17    5:03 PM    [facility] [address] [place], [place], [address] Phone: [phone]   Fax:  [phone]  Name: Solymar Grace MRN: [id number] Date of Birth: 1955-06-13

## 2017-07-23 ENCOUNTER — Ambulatory Visit: Payer: Medicare HMO | Admitting: Physical Therapy

## 2017-07-23 ENCOUNTER — Encounter: Payer: Self-pay | Admitting: Physical Therapy

## 2017-07-23 DIAGNOSIS — Z7952 Long term (current) use of systemic steroids: Secondary | ICD-10-CM | POA: Diagnosis not present

## 2017-07-23 DIAGNOSIS — M21371 Foot drop, right foot: Secondary | ICD-10-CM

## 2017-07-23 DIAGNOSIS — Z79890 Hormone replacement therapy (postmenopausal): Secondary | ICD-10-CM | POA: Diagnosis not present

## 2017-07-23 DIAGNOSIS — I69351 Hemiplegia and hemiparesis following cerebral infarction affecting right dominant side: Secondary | ICD-10-CM

## 2017-07-23 DIAGNOSIS — Z823 Family history of stroke: Secondary | ICD-10-CM | POA: Diagnosis not present

## 2017-07-23 DIAGNOSIS — Z87891 Personal history of nicotine dependence: Secondary | ICD-10-CM | POA: Diagnosis not present

## 2017-07-23 DIAGNOSIS — Z8249 Family history of ischemic heart disease and other diseases of the circulatory system: Secondary | ICD-10-CM | POA: Diagnosis not present

## 2017-07-23 DIAGNOSIS — R740 Nonspecific elevation of levels of transaminase and lactic acid dehydrogenase [LDH]: Secondary | ICD-10-CM | POA: Diagnosis present

## 2017-07-23 DIAGNOSIS — Z800 Family history of malignant neoplasm of digestive organs: Secondary | ICD-10-CM | POA: Diagnosis not present

## 2017-07-23 DIAGNOSIS — R208 Other disturbances of skin sensation: Secondary | ICD-10-CM

## 2017-07-23 DIAGNOSIS — R2681 Unsteadiness on feet: Secondary | ICD-10-CM

## 2017-07-23 DIAGNOSIS — I10 Essential (primary) hypertension: Secondary | ICD-10-CM | POA: Diagnosis present

## 2017-07-23 DIAGNOSIS — R2689 Other abnormalities of gait and mobility: Secondary | ICD-10-CM

## 2017-07-23 DIAGNOSIS — Z7902 Long term (current) use of antithrombotics/antiplatelets: Secondary | ICD-10-CM | POA: Diagnosis not present

## 2017-07-23 DIAGNOSIS — E876 Hypokalemia: Secondary | ICD-10-CM | POA: Diagnosis present

## 2017-07-23 DIAGNOSIS — I639 Cerebral infarction, unspecified: Secondary | ICD-10-CM | POA: Diagnosis present

## 2017-07-23 DIAGNOSIS — E039 Hypothyroidism, unspecified: Secondary | ICD-10-CM | POA: Diagnosis present

## 2017-07-23 DIAGNOSIS — M62838 Other muscle spasm: Secondary | ICD-10-CM | POA: Diagnosis present

## 2017-07-23 DIAGNOSIS — E785 Hyperlipidemia, unspecified: Secondary | ICD-10-CM | POA: Diagnosis present

## 2017-07-23 NOTE — Therapy (Signed)
[facility] [address] [place], [place], [address] Phone: [phone]   Fax:  [phone]  Physical Therapy Treatment  Patient Details  Name: Kristin Mahoney MRN: [id number] Date of Birth: 1956-01-23 Referring Provider: Dennard Schaumann, MD   Encounter Date: 07/23/2017  PT End of Session - 07/23/17 0848    Visit Number  3    Number of Visits  17    Date for PT Re-Evaluation  08/31/17    Authorization Type  [employer] H[employer]    PT Start Time  0800    PT Stop Time  0849    PT Time Calculation (min)  49 min    Equipment Utilized During Treatment  Gait belt    Activity Tolerance  Patient tolerated treatment well    Behavior During Therapy  [facility] for tasks assessed/performed       Past Medical History:  Diagnosis Date  . Hypertension   . Stroke (HCC)   . Thyroid disease     Past Surgical History:  Procedure Laterality Date  . APPENDECTOMY      There were no vitals filed for this visit.  Subjective Assessment - 07/23/17 0803    Subjective  pt reports she is feeling well. pt reports no falls since last PT session.   Legs feel stronger today now that she has stopped medication for sinus infection.    Patient is accompained by:  Family member    Pertinent History  HTN, CVA, thyroid disease    Limitations  Walking    Patient Stated Goals  Use the Bioness for her RLE and stop using the cane.    Currently in Pain?  No/denies         [facility] PT Assessment - 07/23/17 0800      Standardized Balance Assessment   Standardized Balance Assessment  Berg Balance Test      Berg Balance Test   Sit to Stand  Able to stand without using hands and stabilize independently    Standing Unsupported  Able to stand safely 2 minutes    Sitting with Back Unsupported but Feet Supported on Floor or Stool  Able to sit safely and securely 2 minutes    Stand to Sit  Sits safely with minimal use of hands    Transfers  Able to  transfer safely, minor use of hands    Standing Unsupported with Eyes Closed  Able to stand 10 seconds with supervision    Standing Ubsupported with Feet Together  Needs help to attain position but able to stand for 30 seconds with feet together    From Standing, Reach Forward with Outstretched Arm  Can reach forward >12 cm safely (5")    From Standing Position, Pick up Object from Floor  Able to pick up shoe, needs supervision    From Standing Position, Turn to Look Behind Over each Shoulder  Looks behind from both sides and weight shifts well    Turn 360 Degrees  Needs assistance while turning    Standing Unsupported, Alternately Place Feet on Step/Stool  Needs assistance to keep from falling or unable to try    Standing Unsupported, One Foot in Front  Able to take small step independently and hold 30 seconds    Standing on One Leg  Tries to lift leg/unable to hold 3 seconds but remains standing independently    Total Score  37    Berg comment:  Initial 37/56  Patient demonstrates increased fall risk as noted by score of 37/56 on Berg Balance Scale.  (<36= high risk for falls, close to 100%; 37-45 significant >80%; 46-51 moderate >50%; 52-55 lower >25%)       No data recorded       [facility] Adult PT Treatment/Exercise - 07/23/17 0800      Transfers   Transfers  Sit to Stand;Stand to Sit    Sit to Stand  6: Modified independent (Device/Increase time)    Stand to Sit  6: Modified independent (Device/Increase time)      Ambulation/Gait   Ambulation/Gait  Yes    Ambulation/Gait Assistance  6: Modified independent (Device/Increase time)    Ambulation Distance (Feet)  160 Feet    Assistive device  Straight cane    Gait Pattern  Step-through pattern    Ambulation Surface  Level;Indoor      High Level Balance   High Level Balance Comments  see HEP for balnce training.      Exercises   Exercises  Other Exercises    Other Exercises   See HEP for Proximal Hip and dorsiflexion  strengthening      Modalities   Modalities  Electrical Stimulation      Electrical Stimulation   Electrical Stimulation Location  R anterior tibialis and R hamstring muscles    Electrical Stimulation Action  open and closed chain ankle DF, hip extension and knee flecion    Electrical Stimulation Parameters  see saved paramerters in tablet 1: quick fit    Electrical Stimulation Goals  Strength;Neuromuscular facilitation       Bridging    Slowly raise buttocks from floor, keeping stomach tight. Repeat _5-8___ times per set.  Do __2__ sessions per day.  Bracing With March in Bridging (Hook-Lying)    With neutral spine, tighten pelvic floor and abdominals and hold. Lift bottom and hold, then march in place, right foot up and then left foot up, lower hips. March _5-8__ times. Do _2__ times a day.    Walking on Heels    Walk on heels along counter top; one hand holding countertop, other hand holding cane while continuing on a straight path.  Walk backwards regular and start again. Do 4  Laps.       PT Education - 07/23/17 0820    Education provided  Yes    Education Details  Bioness set up, ed on home unit, HEP    Person(s) Educated  Patient    Methods  Explanation;Demonstration    Comprehension  Verbalized understanding       PT Short Term Goals - 07/02/17 2107      PT SHORT TERM GOAL #1   Title  Pt will participate in further assessment of balance and gait impairments with BERG and DGI    Baseline  Not assessed to date    Time  4    Period  Weeks    Status  New    Target Date  08/01/17      PT SHORT TERM GOAL #2   Title  Pt will demonstrate increased gait speed to >/= 2.3 ft/sec with cane and Bioness    Baseline  1.9 ft/sec without Bioness; 2.1 with Bioness    Time  4    Period  Weeks    Status  New    Target Date  08/01/17      PT SHORT TERM GOAL #3   Title  Pt will ambulate 350' over indoor surfaces and  will negotiate 4 stairs with one rail,  alternating sequence MOD I with cane and Bioness with improved R foot clearance, heel strike at initial contact, and decreased recurvatum during stance    Baseline  R genu recurvatum, decreased R hip and knee flexion during swing, foot flat at initial contact    Time  4    Period  Weeks    Status  New    Target Date  08/01/17      PT SHORT TERM GOAL #4   Title  Will initiate and submit paperwork for home Bioness unit    Baseline  no paperwork submitted to date    Time  4    Period  Weeks    Status  New    Target Date  08/01/17        PT Long Term Goals - 07/23/17 1657      PT LONG TERM GOAL #1   Title  Pt will demonstrate independence with LE strengthening and balance HEP    Baseline  no HEP to date    Time  8    Period  Weeks    Status  New      PT LONG TERM GOAL #2   Title  If approved for home Bioness unit; pt will demonstrate independence with donning, care, skin check and wear schedule    Baseline  dependent to date    Time  8    Period  Weeks    Status  New      PT LONG TERM GOAL #3   Title  Pt will improve gait velocity to >/= 2.62 ft/sec to improve safety as community ambulator    Baseline  1.91 ft/sec with cane    Time  8    Period  Weeks    Status  New      PT LONG TERM GOAL #4   Title  Pt will improve DGI by 4 points to decrease falls risk    Baseline  11/24    Time  8    Period  Weeks    Status  Revised      PT LONG TERM GOAL #5   Title  Pt will improve BERG score by 8 points to decrease falls risk    Baseline  07/23/2017 BERG 37/56    Time  8    Period  Weeks    Status  New      PT LONG TERM GOAL #6   Title  Pt will ambulate >200' over indoor surfaces without cane; 250' over outdoor paved surfaces with cane; negotiate 4 stairs with one rail, alternating sequence, NO cane at MOD I with improved RLE mechanics    Baseline  R genu recurvatum, decreased R hip and knee flexion during swing, foot flat at initial contact    Time  8    Period  Weeks     Status  New            Plan - 07/23/17 1652    Clinical Impression Statement  During today's treatment session patient continues to demonstrate improved gait pattern with Bioness during gait. PT and pt filled out documentation for home Bioness unit in addition to developing a HEP using the bioness to improve Proximal hip and dorsiflexion strength as well as balance. Pt also completed the Berg Balance assessment with a score of 37/56 indicating a significant falls risk. Berg completed without the use of Bioness.     Rehab Potential  Good    PT Frequency  2x / week    PT Duration  8 weeks    PT Treatment/Interventions  ADLs/Self Care Home Management;Electrical Stimulation;DME Instruction;Gait training;Stair training;Functional mobility training;Therapeutic activities;Therapeutic exercise;Balance training;Neuromuscular re-education;Patient/family education;Passive range of motion    PT Next Visit Plan  STG due by 3/31; Bioness - may need steering electrode; stairs with Bioness;  RLE NMR.  Bioness with treadmill    Consulted and Agree with Plan of Care  Patient;Family member/caregiver    Family Member Consulted  significant other       Patient will benefit from skilled therapeutic intervention in order to improve the following deficits and impairments:  Abnormal gait, Decreased balance, Decreased strength, Difficulty walking, Impaired sensation, Pain, Impaired tone  Visit Diagnosis: Hemiplegia and hemiparesis following cerebral infarction affecting right dominant side (HCC)  Foot drop, right  Other abnormalities of gait and mobility  Other disturbances of skin sensation  Unsteadiness on feet     Problem List There are no active problems to display for this patient.   Merton Border  SPT 07/23/2017, 4:57 PM  [facility] [address] [place], [place], [address] Phone: [phone]   Fax:  [phone]  Name:  Kristin Mahoney MRN: [id number] Date of Birth: Dec 22, 1955

## 2017-07-23 NOTE — Patient Instructions (Signed)
Bridging    Slowly raise buttocks from floor, keeping stomach tight. Repeat _5-8___ times per set.  Do __2__ sessions per day.  Bracing With March in Bridging (Hook-Lying)    With neutral spine, tighten pelvic floor and abdominals and hold. Lift bottom and hold, then march in place, right foot up and then left foot up, lower hips. March _5-8__ times. Do _2__ times a day.    Walking on Heels    Walk on heels along counter top; one hand holding countertop, other hand holding cane while continuing on a straight path.  Walk backwards regular and start again. Do 4  Laps.

## 2017-07-25 ENCOUNTER — Inpatient Hospital Stay (HOSPITAL_BASED_OUTPATIENT_CLINIC_OR_DEPARTMENT_OTHER)
Admission: EM | Admit: 2017-07-25 | Discharge: 2017-07-26 | DRG: 057 | Disposition: A | Discharge status: Home / Self Care | Payer: Medicare Other | Source: Ambulatory Visit | Attending: Internal Medicine | Admitting: Internal Medicine

## 2017-07-25 ENCOUNTER — Emergency Department (HOSPITAL_BASED_OUTPATIENT_CLINIC_OR_DEPARTMENT_OTHER): Payer: Medicare Other

## 2017-07-25 ENCOUNTER — Other Ambulatory Visit: Payer: Self-pay

## 2017-07-25 ENCOUNTER — Encounter (HOSPITAL_BASED_OUTPATIENT_CLINIC_OR_DEPARTMENT_OTHER): Payer: Self-pay | Admitting: Emergency Medicine

## 2017-07-25 DIAGNOSIS — Z7902 Long term (current) use of antithrombotics/antiplatelets: Secondary | ICD-10-CM

## 2017-07-25 DIAGNOSIS — R1011 Right upper quadrant pain: Secondary | ICD-10-CM | POA: Diagnosis present

## 2017-07-25 DIAGNOSIS — R51 Headache: Secondary | ICD-10-CM

## 2017-07-25 DIAGNOSIS — Z8249 Family history of ischemic heart disease and other diseases of the circulatory system: Secondary | ICD-10-CM | POA: Diagnosis not present

## 2017-07-25 DIAGNOSIS — I10 Essential (primary) hypertension: Secondary | ICD-10-CM | POA: Diagnosis present

## 2017-07-25 DIAGNOSIS — Z823 Family history of stroke: Secondary | ICD-10-CM

## 2017-07-25 DIAGNOSIS — I69351 Hemiplegia and hemiparesis following cerebral infarction affecting right dominant side: Secondary | ICD-10-CM | POA: Diagnosis not present

## 2017-07-25 DIAGNOSIS — Z79890 Hormone replacement therapy (postmenopausal): Secondary | ICD-10-CM | POA: Diagnosis not present

## 2017-07-25 DIAGNOSIS — E876 Hypokalemia: Secondary | ICD-10-CM | POA: Diagnosis present

## 2017-07-25 DIAGNOSIS — I639 Cerebral infarction, unspecified: Secondary | ICD-10-CM | POA: Diagnosis present

## 2017-07-25 DIAGNOSIS — Z800 Family history of malignant neoplasm of digestive organs: Secondary | ICD-10-CM

## 2017-07-25 DIAGNOSIS — R109 Unspecified abdominal pain: Secondary | ICD-10-CM

## 2017-07-25 DIAGNOSIS — Z87891 Personal history of nicotine dependence: Secondary | ICD-10-CM

## 2017-07-25 DIAGNOSIS — R531 Weakness: Secondary | ICD-10-CM | POA: Diagnosis not present

## 2017-07-25 DIAGNOSIS — E039 Hypothyroidism, unspecified: Secondary | ICD-10-CM | POA: Diagnosis not present

## 2017-07-25 DIAGNOSIS — E785 Hyperlipidemia, unspecified: Secondary | ICD-10-CM | POA: Diagnosis present

## 2017-07-25 DIAGNOSIS — M542 Cervicalgia: Secondary | ICD-10-CM | POA: Diagnosis present

## 2017-07-25 DIAGNOSIS — R740 Nonspecific elevation of levels of transaminase and lactic acid dehydrogenase [LDH]: Secondary | ICD-10-CM | POA: Diagnosis present

## 2017-07-25 DIAGNOSIS — M62838 Other muscle spasm: Secondary | ICD-10-CM | POA: Diagnosis present

## 2017-07-25 DIAGNOSIS — Z7952 Long term (current) use of systemic steroids: Secondary | ICD-10-CM | POA: Diagnosis not present

## 2017-07-25 DIAGNOSIS — R519 Headache, unspecified: Secondary | ICD-10-CM | POA: Diagnosis present

## 2017-07-25 HISTORY — DX: Hyperlipidemia, unspecified: E78.5

## 2017-07-25 HISTORY — DX: Hypothyroidism, unspecified: E03.9

## 2017-07-25 LAB — URINALYSIS, MICROSCOPIC (REFLEX)

## 2017-07-25 LAB — CBC
HCT: 39.2 % (ref 36.0–46.0)
Hemoglobin: 12.9 g/dL (ref 12.0–15.0)
MCH: 31.3 pg (ref 26.0–34.0)
MCHC: 32.9 g/dL (ref 30.0–36.0)
MCV: 95.1 fL (ref 78.0–100.0)
Platelets: 235 10*3/uL (ref 150–400)
RBC: 4.12 MIL/uL (ref 3.87–5.11)
RDW: 14.6 % (ref 11.5–15.5)
WBC: 8.7 10*3/uL (ref 4.0–10.5)

## 2017-07-25 LAB — URINALYSIS, ROUTINE W REFLEX MICROSCOPIC
BILIRUBIN URINE: NEGATIVE
Glucose, UA: NEGATIVE mg/dL
KETONES UR: NEGATIVE mg/dL
NITRITE: NEGATIVE
Protein, ur: NEGATIVE mg/dL
SPECIFIC GRAVITY, URINE: 1.015 (ref 1.005–1.030)
pH: 6.5 (ref 5.0–8.0)

## 2017-07-25 LAB — SEDIMENTATION RATE: Sed Rate: 3 mm/hr (ref 0–22)

## 2017-07-25 LAB — COMPREHENSIVE METABOLIC PANEL
ALT: 74 U/L — ABNORMAL HIGH (ref 14–54)
AST: 35 U/L (ref 15–41)
Albumin: 4 g/dL (ref 3.5–5.0)
Alkaline Phosphatase: 62 U/L (ref 38–126)
Anion gap: 9 (ref 5–15)
BUN: 18 mg/dL (ref 6–20)
CO2: 21 mmol/L — ABNORMAL LOW (ref 22–32)
Calcium: 8.8 mg/dL — ABNORMAL LOW (ref 8.9–10.3)
Chloride: 106 mmol/L (ref 101–111)
Creatinine, Ser: 0.82 mg/dL (ref 0.44–1.00)
GFR calc Af Amer: >60 mL/min (ref >60)
GFR calc non Af Amer: >60 mL/min (ref >60)
Glucose, Bld: 131 mg/dL — ABNORMAL HIGH (ref 65–99)
Potassium: 3.3 mmol/L — ABNORMAL LOW (ref 3.5–5.1)
Sodium: 136 mmol/L (ref 135–145)
Total Bilirubin: 0.5 mg/dL (ref 0.3–1.2)
Total Protein: 7.2 g/dL (ref 6.5–8.1)

## 2017-07-25 LAB — MAGNESIUM: Magnesium: 2.3 mg/dL (ref 1.7–2.4)

## 2017-07-25 LAB — PROTIME-INR
INR: 0.95
Prothrombin Time: 12.6 seconds (ref 11.4–15.2)

## 2017-07-25 LAB — ACETAMINOPHEN LEVEL

## 2017-07-25 LAB — CBG MONITORING, ED: GLUCOSE-CAPILLARY: 130 mg/dL — AB (ref 65–99)

## 2017-07-25 LAB — LIPASE, BLOOD: Lipase: 38 U/L (ref 11–51)

## 2017-07-25 LAB — TROPONIN I: Troponin I: <0.03 ng/mL (ref <0.03)

## 2017-07-25 LAB — APTT: aPTT: 28 seconds (ref 24–36)

## 2017-07-25 LAB — C-REACTIVE PROTEIN: CRP: <0.8 mg/dL (ref <1.0)

## 2017-07-25 MED ORDER — OXYCODONE HCL 5 MG PO TABS: 5.0000 mg | ORAL_TABLET | Freq: Four times a day (QID) | ORAL | Status: DC | PRN

## 2017-07-25 MED ORDER — ADULT MULTIVITAMIN W/MINERALS CH
1.0000 | ORAL_TABLET | Freq: Every day | ORAL | Status: DC
  Administered 2017-07-26: 1 via ORAL
  Filled 2017-07-25: qty 1

## 2017-07-25 MED ORDER — SENNOSIDES-DOCUSATE SODIUM 8.6-50 MG PO TABS: 1.0000 | ORAL_TABLET | Freq: Every evening | ORAL | Status: DC | PRN

## 2017-07-25 MED ORDER — AMLODIPINE BESYLATE 5 MG PO TABS
5.0000 mg | ORAL_TABLET | Freq: Every day | ORAL | Status: DC
  Administered 2017-07-25 – 2017-07-26 (×2): 5 mg via ORAL
  Filled 2017-07-25 (×3): qty 1

## 2017-07-25 MED ORDER — ENOXAPARIN SODIUM 40 MG/0.4ML ~~LOC~~ SOLN
40.0000 mg | SUBCUTANEOUS | Status: DC
  Administered 2017-07-25: 40 mg via SUBCUTANEOUS
  Filled 2017-07-25: qty 0.4

## 2017-07-25 MED ORDER — HYDROCODONE-ACETAMINOPHEN 5-325 MG PO TABS
1.0000 | ORAL_TABLET | Freq: Four times a day (QID) | ORAL | Status: DC | PRN
  Filled 2017-07-25: qty 1

## 2017-07-25 MED ORDER — STROKE: EARLY STAGES OF RECOVERY BOOK
Freq: Once | Status: AC
  Administered 2017-07-25: 1

## 2017-07-25 MED ORDER — HYDROCODONE-ACETAMINOPHEN 5-325 MG PO TABS
1.0000 | ORAL_TABLET | Freq: Four times a day (QID) | ORAL | Status: DC | PRN
  Administered 2017-07-26: 1 via ORAL
  Filled 2017-07-25: qty 1

## 2017-07-25 MED ORDER — POTASSIUM CHLORIDE CRYS ER 20 MEQ PO TBCR
40.0000 meq | EXTENDED_RELEASE_TABLET | Freq: Once | ORAL | Status: AC
  Administered 2017-07-25: 40 meq via ORAL
  Filled 2017-07-25: qty 2

## 2017-07-25 MED ORDER — VITAMIN D 1000 UNITS PO TABS
1000.0000 [IU] | ORAL_TABLET | Freq: Every day | ORAL | Status: DC
  Administered 2017-07-26: 1000 [IU] via ORAL
  Filled 2017-07-25 (×2): qty 1

## 2017-07-25 MED ORDER — CENTRUM SILVER ADULT 50+ PO TABS: 1.0000 | ORAL_TABLET | Freq: Every day | ORAL | Status: DC

## 2017-07-25 MED ORDER — ATORVASTATIN CALCIUM 10 MG PO TABS: 20.0000 mg | ORAL_TABLET | Freq: Every day | ORAL | Status: DC

## 2017-07-25 MED ORDER — PREDNISONE 5 MG PO TABS: 5.0000 mg | ORAL_TABLET | Freq: Two times a day (BID) | ORAL | Status: DC

## 2017-07-25 MED ORDER — BACLOFEN 10 MG PO TABS
5.0000 mg | ORAL_TABLET | Freq: Two times a day (BID) | ORAL | Status: DC
  Administered 2017-07-26: 5 mg via ORAL
  Filled 2017-07-25 (×2): qty 1

## 2017-07-25 MED ORDER — FOSFOMYCIN TROMETHAMINE 3 G PO PACK: 3.0000 g | PACK | Freq: Once | ORAL | Status: DC

## 2017-07-25 MED ORDER — CLOPIDOGREL BISULFATE 75 MG PO TABS
75.0000 mg | ORAL_TABLET | Freq: Every day | ORAL | Status: DC
  Administered 2017-07-25 – 2017-07-26 (×2): 75 mg via ORAL
  Filled 2017-07-25 (×2): qty 1

## 2017-07-25 MED ORDER — MAGNESIUM 250 MG PO TABS: 250.0000 mg | ORAL_TABLET | Freq: Every day | ORAL | Status: DC

## 2017-07-25 MED ORDER — SODIUM CHLORIDE 0.9 % IV SOLN
INTRAVENOUS | Status: DC
  Administered 2017-07-25: 23:00:00 via INTRAVENOUS

## 2017-07-25 MED ORDER — IBUPROFEN 200 MG PO TABS: 200.0000 mg | ORAL_TABLET | Freq: Four times a day (QID) | ORAL | Status: DC | PRN

## 2017-07-25 MED ORDER — LISINOPRIL 10 MG PO TABS
10.0000 mg | ORAL_TABLET | Freq: Every day | ORAL | Status: DC
  Administered 2017-07-26: 10 mg via ORAL
  Filled 2017-07-25: qty 1

## 2017-07-25 MED ORDER — HYDRALAZINE HCL 20 MG/ML IJ SOLN: 5.0000 mg | INTRAMUSCULAR | Status: DC | PRN

## 2017-07-25 MED ORDER — ZOLPIDEM TARTRATE 5 MG PO TABS: 5.0000 mg | ORAL_TABLET | Freq: Every evening | ORAL | Status: DC | PRN

## 2017-07-25 MED ORDER — MAGNESIUM OXIDE 400 (241.3 MG) MG PO TABS
200.0000 mg | ORAL_TABLET | Freq: Every day | ORAL | Status: DC
  Administered 2017-07-25 – 2017-07-26 (×2): 200 mg via ORAL
  Filled 2017-07-25 (×2): qty 1

## 2017-07-25 MED ORDER — LEVOTHYROXINE SODIUM 25 MCG PO TABS
125.0000 ug | ORAL_TABLET | Freq: Every day | ORAL | Status: DC
  Administered 2017-07-26: 10:00:00 125 ug via ORAL
  Filled 2017-07-25: qty 1

## 2017-07-25 MED ORDER — ONDANSETRON HCL 4 MG/2ML IJ SOLN: 4.0000 mg | Freq: Three times a day (TID) | INTRAMUSCULAR | Status: DC | PRN

## 2017-07-25 NOTE — ED Notes (Signed)
Teleneuro cart at bedside. They have paged out the consult

## 2017-07-25 NOTE — H&P (Signed)
History and Physical    Kristin Mahoney BSJ:[id number] DOB: 1956-04-19 DOA: 07/25/2017  Referring MD/NP/PA:   PCP: Lorelei Pont, MD   Patient coming from:  The patient is coming from home.  At baseline, pt is independent for most of ADL. SNF  Assistant living facility   [facility].       Chief Complaint: worsening right sided weakness, pain right neck, right face and head  HPI: Kristin Mahoney is a 62 y.o. 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   Pt states that she started feeling worsening right-sided weakness since some point yesterday, but is unable to pinpoint what exactly it started. She states that it has been more than 24 hours. She also has pain in the right neck, head and face, which is constantly, 5 out of 10 intensity, spasma-like pain, nonradiating. No proptosis. Patient has mild right facial droop, no new vision change or hearing loss. No slurred speech. Patient denies chest pain, shortness breath, cough, fever, chills. She states that she has intermittent mild right upper quadrant abdominal pain, but no nausea vomiting or diarrhea. The abdominal pain has been going on for a while. Patient states that she has chronically increased urinary frequency, which has not changed. Denies dysuria or burning on urination.  ED Course: pt was found to have WBC 8.7, potassium 3.3, elevated  ALT 74, with normal AST, ALP and total bilirubin, urinalysis with trace amount of leukocytes and many bacteria, but no pyuria. INR 0.95, negative troponin, temperature 99.2, no tachycardia, oxygen saturation section 97% on room air, negative chest x-ray, negative CT head for acute intracranial abnormalities.  Review of Systems:   General: no fevers, chills, no body weight gain, has fatigue HEENT: no blurry vision,  hearing changes or sore throat. Has R facial and neck pain Respiratory: no dyspnea, coughing, wheezing CV: no chest pain, no palpitations GI: no nausea, vomiting, has abdominal pain, no diarrhea, constipation GU: no dysuria, burning on urination, increased urinary frequency, hematuria  Ext: no leg edema Neuro: No vision change or hearing loss. Has worsening R weakness. Skin: no rash, no skin tear. MSK: No muscle spasm, no deformity, no limitation of range of movement in spin Heme: No easy bruising.  Travel history: No recent long distant travel.  Allergy: No Known Allergies  Past Medical History:  Diagnosis Date  . HLD (hyperlipidemia)   . Hypertension   . Hypothyroidism   . Stroke ([place])   . Thyroid disease     Past Surgical History:  Procedure Laterality Date  . APPENDECTOMY      Social History:  reports that she has quit smoking. She has never used smokeless tobacco. Her alcohol and drug histories are not on file.  Family History:  Family History  Problem Relation Age of Onset  . Hypertension Mother   . Stroke Mother   . Heart disease Mother   . Hypertension Sister   . Pancreatic cancer Brother      Prior to Admission medications   Medication Sig Start Date End Date Taking? Authorizing Provider  amLODipine (NORVASC) 5 MG tablet Take 5 mg by mouth daily. 06/17/15  Yes [provider]  atorvastatin (LIPITOR) 20 MG tablet Take 20 mg by mouth daily. 07/05/15  Yes [provider]  baclofen (LIORESAL) 10 MG tablet Take 5 mg by mouth 2 (two) times daily. Reported on  07/17/2015 05/10/15  Yes [provider]  Cholecalciferol (VITAMIN D PO) Take by mouth.   Yes [provider]  clopidogrel (PLAVIX) 75 MG tablet Take 75 mg by mouth daily. 06/27/15  Yes [provider]  HYDROcodone-acetaminophen (NORCO) 10-325 MG tablet Take 1 tablet by mouth every 6 (six) hours as needed.   Yes [provider]  levothyroxine (SYNTHROID, LEVOTHROID)  125 MCG tablet Take 125 mcg by mouth daily. 06/19/15  Yes [provider]  lisinopril (PRINIVIL,ZESTRIL) 10 MG tablet Take 10 mg by mouth daily. 04/19/15  Yes [provider]  Multiple Vitamins-Minerals (CENTRUM SILVER ADULT 50+ PO) Take 1 tablet by mouth daily.   Yes [provider]  Vitamin D, Ergocalciferol, (DRISDOL) 50000 units CAPS capsule Take 50,000 Units by mouth every 7 (seven) days.   Yes [provider]  HYDROcodone-acetaminophen (NORCO/VICODIN) 5-325 MG tablet Take 1 tablet by mouth every 6 (six) hours as needed for moderate pain.  06/24/15   [provider]  Magnesium 250 MG TABS Take 250 mg by mouth daily.     [provider]  predniSONE (DELTASONE) 5 MG tablet Take 5 mg by mouth 2 (two) times daily with a meal.    [provider]    Physical Exam: Vitals:   07/25/17 2200 07/25/17 2354 07/26/17 0200 07/26/17 0414  BP: 110/67 (!) 101/58 116/76 129/82  Pulse: 72 69 68 62  Resp: _0 Temp: 98.9 F (37.2 C) 98.6 F (37 C) 98.8 F (37.1 C) 98.5 F (36.9 C)  TempSrc: Oral Oral Oral Oral  SpO2: 97% 99% 99% 97%  Weight:      Height:       General: Not in acute distress HEENT:       Eyes: PERRL, EOMI, no scleral icterus.       ENT: No discharge from the ears and nose, no pharynx injection, no tonsillar enlargement.        Neck: No JVD, no bruit, no mass felt. Heme: No neck lymph node enlargement. Cardiac: S1/S2, RRR, No murmurs, No gallops or rubs. Respiratory: No rales, wheezing, rhonchi or rubs. GI: Soft, nondistended, nontender, no rebound pain, no organomegaly, BS present. GU: No hematuria Ext: No pitting leg edema bilaterally. 2+DP/PT pulse bilaterally. Musculoskeletal: No joint deformities, No joint redness or warmth, no limitation of ROM in spin. Skin: No rashes.  Neuro: Alert, oriented X3, cranial nerves II-XII grossly intact except for R facial droop. Muscle strength 2/5 in right leg and 4/5 in  right arm. sensation to light touch intact. Brachial reflex 2+ bilaterally. Negative Babinski's sign.  Psych: Patient is not psychotic, no suicidal or hemocidal ideation.  Labs on Admission: I have personally reviewed following labs and imaging studies  CBC: Recent Labs  Lab 07/25/17 1558  WBC 8.7  HGB 12.9  HCT 39.2  MCV 95.1  PLT 578   Basic Metabolic Panel: Recent Labs  Lab 07/25/17 1558 07/25/17 2109  NA 136  --   K 3.3*  --   CL 106  --   CO2 21*  --   GLUCOSE 131*  --   BUN 18  --   CREATININE 0.82  --   CALCIUM 8.8*  --   MG  --  2.3   GFR: Estimated Creatinine Clearance: 76.1 mL/min (by C-G formula based on SCr of 0.82 mg/dL). Liver Function Tests: Recent Labs  Lab 07/25/17 1558  AST 35  ALT 74*  ALKPHOS 62  BILITOT 0.5  PROT 7.2  ALBUMIN 4.0   Recent Labs  Lab 07/25/17 2109  LIPASE 38   No results for input(s): AMMONIA in the last 168 hours. Coagulation Profile: Recent Labs  Lab 07/25/17 1558  INR 0.95   Cardiac Enzymes: Recent Labs  Lab 07/25/17 1558  TROPONINI <0.03   BNP (last 3 results) No results for input(s): PROBNP in the last 8760 hours. HbA1C: No results for input(s): HGBA1C in the last 72 hours. CBG: Recent Labs  Lab 07/25/17 1528  GLUCAP 130*   Lipid Profile: No results for input(s): CHOL, HDL, LDLCALC, TRIG, CHOLHDL, LDLDIRECT in the last 72 hours. Thyroid Function Tests: No results for input(s): TSH, T4TOTAL, FREET4, T3FREE, THYROIDAB in the last 72 hours. Anemia Panel: No results for input(s): VITAMINB12, FOLATE, FERRITIN, TIBC, IRON, RETICCTPCT in the last 72 hours. Urine analysis:    Component Value Date/Time   COLORURINE YELLOW 07/25/2017 [place] 07/25/2017 1725   LABSPEC 1.015 07/25/2017 1725   PHURINE 6.5 07/25/2017 1725   GLUCOSEU NEGATIVE 07/25/2017 1725   HGBUR TRACE (A) 07/25/2017 1725   BILIRUBINUR NEGATIVE 07/25/2017 1725   KETONESUR NEGATIVE 07/25/2017 1725   PROTEINUR NEGATIVE  07/25/2017 1725   NITRITE NEGATIVE 07/25/2017 1725   LEUKOCYTESUR TRACE (A) 07/25/2017 1725   Sepsis Labs: _0 (procalcitonin:4,lacticidven:4) )No results found for this or any previous visit (from the past 240 hour(s)).   Radiological Exams on Admission: Ct Angio Head W Or Wo Contrast  Result Date: 07/26/2017 CLINICAL DATA:  Right-sided weakness. EXAM: CT ANGIOGRAPHY HEAD AND NECK TECHNIQUE: Multidetector CT imaging of the head and neck was performed using the standard protocol during bolus administration of intravenous contrast. Multiplanar CT image reconstructions and MIPs were obtained to evaluate the vascular anatomy. Carotid stenosis measurements (when applicable) are obtained utilizing NASCET criteria, using the distal internal carotid diameter as the denominator. CONTRAST:  44m ISOVUE-370 IOPAMIDOL (ISOVUE-370) INJECTION 76% COMPARISON:  Head CT 07/25/2017 FINDINGS: CTA NECK FINDINGS Aortic arch: There is mild calcific atherosclerosis of the aortic arch. There is no aneurysm, dissection or hemodynamically significant stenosis of the visualized ascending aorta and aortic arch. Conventional 3 vessel aortic branching pattern. The visualized proximal subclavian arteries are widely patent. Right carotid system: --Common carotid artery: Widely patent origin without common carotid artery dissection or aneurysm. --Internal carotid artery: No dissection, occlusion or aneurysm. No hemodynamically significant stenosis. --External carotid artery: No acute abnormality. Left carotid system: --Common carotid artery: Widely patent origin without common carotid artery dissection or aneurysm. --Internal carotid artery:No dissection, occlusion or aneurysm. No hemodynamically significant stenosis. --External carotid artery: No acute abnormality. Vertebral arteries: Right dominant configuration. Both origins are normal. No dissection, occlusion or flow-limiting stenosis to the vertebrobasilar confluence.  Skeleton: There is no bony spinal canal stenosis. No lytic or blastic lesion. Other neck: 11 mm left thyroid nodule. Upper chest: Biapical bullae. CTA HEAD FINDINGS Anterior circulation: --Intracranial internal carotid arteries: Atherosclerotic calcification of the internal carotid arteries at the skull base without hemodynamically significant stenosis. --Anterior cerebral arteries: Normal. Absent right A1 segment, normal variant --Middle cerebral arteries: Normal. --Posterior communicating arteries: Absent bilaterally. Posterior circulation: --Basilar artery: Normal. --Posterior cerebral arteries: Normal. --Superior cerebellar arteries: Normal. --Inferior cerebellar arteries: Normal anterior and posterior inferior cerebellar arteries. Venous sinuses: As permitted by contrast timing, patent. 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 Delayed phase: No parenchymal contrast enhancement. Review of the MIP images confirms the above findings. IMPRESSION: No emergent large vessel occlusion or hemodynamically significant stenosis of the  arteries of the head and neck. Electronically Signed   By: Ulyses Jarred M.D.   On: 07/26/2017 01:13   Dg Chest 2 View  Result Date: 07/25/2017 CLINICAL DATA:  Weakness EXAM: CHEST - 2 VIEW COMPARISON:  05/27/2014 FINDINGS: Cardiac shadow is within normal limits. The lungs are well aerated bilaterally. No focal infiltrate or sizable effusion is seen. No acute bony abnormality is noted. IMPRESSION: No active cardiopulmonary disease. Electronically Signed   By: Inez Catalina M.D.   On: 07/25/2017 17:57   Ct Head Wo Contrast  Result Date: 07/25/2017 CLINICAL DATA:  Right-sided headache and right-sided weakness since yesterday. EXAM: CT HEAD WITHOUT CONTRAST TECHNIQUE: Contiguous axial images were obtained from the base of the skull through the vertex without intravenous contrast. COMPARISON:  Brain MR and head CT dated 06/27/2016. FINDINGS: Brain:  Diffusely enlarged ventricles and subarachnoid spaces. Stable old left middle cerebral artery distribution the deep white matter and basal ganglia infarct with ex vacuo enlargement of the left lateral ventricle and midline shift to the left. No intracranial hemorrhage, mass lesion or CT evidence of acute infarction. Vascular: No hyperdense vessel or unexpected calcification. Skull: Normal. Negative for fracture or focal lesion. Sinuses/Orbits: Unremarkable. Other: None. IMPRESSION: 1. No acute abnormality. 2. Stable changes due to an old left middle cerebral artery distribution infarct. 3. Mild diffuse cerebral and cerebellar atrophy. Electronically Signed   By: Claudie Revering M.D.   On: 07/25/2017 16:34   Ct Angio Neck W Or Wo Contrast  Result Date: 07/26/2017 CLINICAL DATA:  Right-sided weakness. EXAM: CT ANGIOGRAPHY HEAD AND NECK TECHNIQUE: Multidetector CT imaging of the head and neck was performed using the standard protocol during bolus administration of intravenous contrast. Multiplanar CT image reconstructions and MIPs were obtained to evaluate the vascular anatomy. Carotid stenosis measurements (when applicable) are obtained utilizing NASCET criteria, using the distal internal carotid diameter as the denominator. CONTRAST:  12m ISOVUE-370 IOPAMIDOL (ISOVUE-370) INJECTION 76% COMPARISON:  Head CT 07/25/2017 FINDINGS: CTA NECK FINDINGS Aortic arch: There is mild calcific atherosclerosis of the aortic arch. There is no aneurysm, dissection or hemodynamically significant stenosis of the visualized ascending aorta and aortic arch. Conventional 3 vessel aortic branching pattern. The visualized proximal subclavian arteries are widely patent. Right carotid system: --Common carotid artery: Widely patent origin without common carotid artery dissection or aneurysm. --Internal carotid artery: No dissection, occlusion or aneurysm. No hemodynamically significant stenosis. --External carotid artery: No acute  abnormality. Left carotid system: --Common carotid artery: Widely patent origin without common carotid artery dissection or aneurysm. --Internal carotid artery:No dissection, occlusion or aneurysm. No hemodynamically significant stenosis. --External carotid artery: No acute abnormality. Vertebral arteries: Right dominant configuration. Both origins are normal. No dissection, occlusion or flow-limiting stenosis to the vertebrobasilar confluence. Skeleton: There is no bony spinal canal stenosis. No lytic or blastic lesion. Other neck: 11 mm left thyroid nodule. Upper chest: Biapical bullae. CTA HEAD FINDINGS Anterior circulation: --Intracranial internal carotid arteries: Atherosclerotic calcification of the internal carotid arteries at the skull base without hemodynamically significant stenosis. --Anterior cerebral arteries: Normal. Absent right A1 segment, normal variant --Middle cerebral arteries: Normal. --Posterior communicating arteries: Absent bilaterally. Posterior circulation: --Basilar artery: Normal. --Posterior cerebral arteries: Normal. --Superior cerebellar arteries: Normal. --Inferior cerebellar arteries: Normal anterior and posterior inferior cerebellar arteries. Venous sinuses: As permitted by contrast timing, patent. 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 Delayed phase: No parenchymal contrast enhancement. Review of the MIP images confirms the above findings. IMPRESSION: No emergent large vessel occlusion  or hemodynamically significant stenosis of the arteries of the head and neck. Electronically Signed   By: Ulyses Jarred M.D.   On: 07/26/2017 01:13   Mr Brain Wo Contrast  Result Date: 07/26/2017 CLINICAL DATA:  Stroke follow-up.  Right-sided weakness. EXAM: MRI HEAD WITHOUT CONTRAST TECHNIQUE: Multiplanar, multiecho pulse sequences of the brain and surrounding structures were obtained without intravenous contrast. COMPARISON:  CTA head neck 07/26/2017  FINDINGS: Brain: The midline structures are normal. There is no acute infarct or acute hemorrhage. No mass lesion, hydrocephalus, dural abnormality or extra-axial collection. Old left insula and corona radiata infarct. No age-advanced or lobar predominant atrophy. No chronic microhemorrhage or superficial siderosis. Vascular: Major intracranial arterial and venous sinus flow voids are preserved. Skull and upper cervical spine: The visualized skull base, calvarium, upper cervical spine and extracranial soft tissues are normal. Sinuses/Orbits: Small amount of left mastoid fluid.  Normal orbits. IMPRESSION: Old right MCA infarct involving the left insula and corona radiata. No acute abnormality. Electronically Signed   By: Ulyses Jarred M.D.   On: 07/26/2017 01:55     EKG: Independently reviewed.  Sinus rhythm, QTC 426, low voltage, nonspecific T-wave change.   Assessment/Plan Principal Problem:   Right sided weakness Active Problems:   CVA (cerebral vascular accident) ([place])   Hypertension   HLD (hyperlipidemia)   Hypothyroidism   Hypokalemia   Neck pain on right side   Right-sided face pain   RUQ abdominal pain   Right sided weakness, pain in right neck, head and face: CT-head is negative for acute intracranial abnormalities. Etiology is not clear. Not sure if this is because of sequela of from a previous stroke or new stroke versus TIA. Other differential diagnoses inclued temporal arteritis and arteriolar dissection given face , head and neck pain.  - will admit to tele bed - Obtain MRI - CTA of neck and head  - continue plavix - fasting lipid panel and HbA1c  - 2D transthoracic echocardiography  - PT/OT consult - check ESR and CRP - switch home Norco to prn oxycodone due to abnormal LFT  Hx of stroke:  -on Plavix -hold lipitor due to abnormal LFT  HTN:  -Continue home medications:for amlodipine, lisinopril -IV hydralazine prn  HLD (hyperlipidemia) -Hold  lipitor  Hypothyroidism: Last TSH was 29.5 on 05/03/15 -Continue home Synthroid -Check TSH  Hypokalemia: K=3.3 on admission. - Repleted - Check Mg level  RUQ abdominal pain and abnormal LFT: -check lipase -US-RUQ  Positive UA:  urinalysis with trace amount of leukocytes and many bacteria, but no pyuria. Her increased urinary frequency is chronic issue. patient denies dysuria or burning on urination. Will hold off Abx and f/u Ux. -f/u Ux     DVT ppx: SQ Lovenox Code Status: Full code Family Communication: Yes, patient's  boyfreind at bed side Disposition Plan:  Anticipate discharge back to previous home environment Consults called:  none Admission status:  Inpatient/tele         Date of Service 07/26/2017    Ivor Costa [facility] Pager [phone]  If 7PM-7AM, please contact night-coverage [email or web address] Password [facility] 07/26/2017, 5:37 AM

## 2017-07-25 NOTE — ED Notes (Addendum)
Pt reports she has right side weakness from a stroke in 2011. She uses a cane from this. States yesterday afternoon (does not give specific time) she noticed the weakness was worse than usual "feels like I'm dragging my right side". Pt also c/o right headache that started at the same time. Facial symmetry present. Pt cao x 4.

## 2017-07-25 NOTE — ED Triage Notes (Signed)
Pt reports R sided weakness/numbness since some time yesterday afternoon. Unable to pinpoint what time it started.

## 2017-07-25 NOTE — ED Notes (Signed)
Patient transported to CT 

## 2017-07-25 NOTE — ED Notes (Signed)
Called to give report to 3W. Nurse will call back when is is out of report

## 2017-07-25 NOTE — ED Notes (Signed)
ED Provider at bedside. Law, PA 

## 2017-07-25 NOTE — ED Provider Notes (Signed)
[facility] PROGRESSIVE CARE Provider Note   CSN: [id number] Arrival date & time: 07/25/17  1521     History   Chief Complaint Chief Complaint  Patient presents with  . Numbness    HPI Kristin Mahoney is a 62 y.o. 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  She is unable to pinpoint what exactly it started.  It has been around 24 hours or more.  She reports an abnormal sensation on the right side of her face, but no numbness.  She walks with a cane at baseline, but has noticed her right leg and foot are much weaker than normal.  No interventions tried prior to arrival.  She takes Plavix.  She is not currently following with a neurologist.  She was treated for her stroke in 2011 at [facility] in [place].  She denies any chest pain, shortness of breath, abdominal pain, nausea, vomiting, numbness, vision changes, speech changes.  HPI  Past Medical History:  Diagnosis Date  . HLD (hyperlipidemia)   . Hypertension   . Hypothyroidism   . Stroke (HCC)   . Thyroid disease     Patient Active Problem List   Diagnosis Date Noted  . CVA (cerebral vascular accident) (HCC) 07/25/2017  . Right sided weakness 07/25/2017  . Hypokalemia 07/25/2017  . Neck pain on right side 07/25/2017  . Right-sided face pain 07/25/2017  . RUQ abdominal pain 07/25/2017  . Hypertension   . HLD (hyperlipidemia)   . Hypothyroidism     Past Surgical History:  Procedure Laterality Date  . APPENDECTOMY       OB History   None      Home Medications    Prior to Admission medications   Medication Sig Start Date End Date Taking? Authorizing Provider  amLODipine (NORVASC) 5 MG tablet Take 5 mg by mouth daily. 06/17/15  Yes [provider]  atorvastatin (LIPITOR) 20 MG tablet Take 20 mg by mouth daily. 07/05/15  Yes  [provider]  baclofen (LIORESAL) 10 MG tablet Take 5 mg by mouth 2 (two) times daily. Reported on 07/17/2015 05/10/15  Yes [provider]  Cholecalciferol (VITAMIN D PO) Take by mouth.   Yes [provider]  clopidogrel (PLAVIX) 75 MG tablet Take 75 mg by mouth daily. 06/27/15  Yes [provider]  HYDROcodone-acetaminophen (NORCO) 10-325 MG tablet Take 1 tablet by mouth every 6 (six) hours as needed.   Yes [provider]  levothyroxine (SYNTHROID, LEVOTHROID) 125 MCG tablet Take 125 mcg by mouth daily. 06/19/15  Yes [provider]  lisinopril (PRINIVIL,ZESTRIL) 10 MG tablet Take 10 mg by mouth daily. 04/19/15  Yes [provider]  Multiple Vitamins-Minerals (CENTRUM SILVER ADULT 50+ PO) Take 1 tablet by mouth daily.   Yes [provider]  Vitamin D, Ergocalciferol, (DRISDOL) 50000 units CAPS capsule Take 50,000 Units by mouth every 7 (seven) days.   Yes [provider]  HYDROcodone-acetaminophen (NORCO/VICODIN) 5-325 MG tablet Take 1 tablet by mouth every 6 (six) hours as needed for moderate pain.  06/24/15   [provider]  Magnesium 250 MG TABS Take 250 mg by mouth daily.     [provider]  predniSONE (DELTASONE) 5 MG tablet Take 5 mg by mouth 2 (two) times daily with a meal.    [provider]    Family History Family History  Problem Relation Age of Onset  . Hypertension Mother   . Stroke Mother   . Heart disease Mother   . Hypertension Sister   . Pancreatic cancer Brother     Social History Social History   Tobacco Use  . Smoking status: Former Games developer  . Smokeless tobacco: Never Used  Substance Use Topics  . Alcohol use: Not on file  . Drug use: Not on file     Allergies   Patient has no known allergies.   Review of Systems Review of Systems  Constitutional: Negative for chills and fever.  HENT: Negative for facial swelling and sore throat.   Respiratory:  Negative for shortness of breath.   Cardiovascular: Negative for chest pain.  Gastrointestinal: Negative for abdominal pain, nausea and vomiting.  Genitourinary: Negative for dysuria.  Musculoskeletal: Negative for back pain.  Skin: Negative for rash and wound.  Neurological: Positive for weakness and headaches. Negative for dizziness, facial asymmetry, speech difficulty, light-headedness and numbness.  Psychiatric/Behavioral: The patient is not nervous/anxious.      Physical Exam Updated Vital Signs BP (!) 101/58 (BP Location: Right Arm) Comment: map 69  Pulse 69   Temp 98.6 F (37 C) (Oral)   Resp 17   Ht 5\' 7"  (1.702 m)   Wt 77.2 kg (170 lb 3.1 oz)   SpO2 99%   BMI 26.66 kg/m   Physical Exam  Constitutional: She appears well-developed and well-nourished. No distress.  HENT:  Head: Normocephalic and atraumatic.  Mouth/Throat: Oropharynx is clear and moist. No oropharyngeal exudate.  Eyes: Pupils are equal, round, and reactive to light. Conjunctivae are normal. Right eye exhibits no discharge. Left eye exhibits no discharge. No scleral icterus.  Some nystagmus noted on EOMs, however all directions intact  Neck: Normal range of motion. Neck supple. No thyromegaly present.  Cardiovascular: Normal rate, regular rhythm, normal heart sounds and intact distal pulses. Exam reveals no gallop and no friction rub.  No murmur heard. Pulmonary/Chest: Effort normal and breath sounds normal. No stridor. No respiratory distress. She has no wheezes. She has no rales.  Abdominal: Soft. Bowel sounds are normal. She exhibits no distension. There is no tenderness. There is no rebound and no guarding.    Musculoskeletal: She exhibits no edema.  Lymphadenopathy:    She has no cervical adenopathy.  Neurological: She is alert. Coordination normal.  CN 3-12 intact; normal sensation throughout; 5/5 strength to LUE and LLE, 4/5 strength to RUE and RLE; grip strength weaker on the right; no ataxia on  finger to nose; and slow, but normal heel to shin test   Skin: Skin is warm and dry. No rash noted. She is not diaphoretic. No pallor.  Psychiatric: She has a normal mood and affect.  Nursing note and vitals reviewed.    ED Treatments / Results  Labs (all labs ordered are listed, but only abnormal results are displayed) Labs Reviewed  URINALYSIS, ROUTINE W REFLEX MICROSCOPIC - Abnormal; Notable for the following components:      Result Value   Hgb urine dipstick TRACE (*)    Leukocytes, UA TRACE (*)    All other components within normal limits  COMPREHENSIVE METABOLIC PANEL - Abnormal; Notable for the following components:   Potassium 3.3 (*)    CO2 21 (*)    Glucose, Bld 131 (*)    Calcium 8.8 (*)    ALT 74 (*)    All other components within normal limits  URINALYSIS, MICROSCOPIC (REFLEX) - Abnormal; Notable for  the following components:   Bacteria, UA MANY (*)    Squamous Epithelial / LPF 0-5 (*)    All other components within normal limits  ACETAMINOPHEN LEVEL - Abnormal; Notable for the following components:   Acetaminophen (Tylenol), Serum <10 (*)    All other components within normal limits  CBG MONITORING, ED - Abnormal; Notable for the following components:   Glucose-Capillary 130 (*)    All other components within normal limits  URINE CULTURE  CBC  PROTIME-INR  APTT  TROPONIN I  MAGNESIUM  LIPASE, BLOOD  SEDIMENTATION RATE  C-REACTIVE PROTEIN  HEPATITIS PANEL, ACUTE  HIV ANTIBODY (ROUTINE TESTING)  HEMOGLOBIN A1C  LIPID PANEL  TSH    EKG EKG Interpretation  Date/Time:  Sunday July 25 2017 15:34:56 EDT Ventricular Rate:  77 PR Interval:    QRS Duration: 78 QT Interval:  376 QTC Calculation: 426 R Axis:   37 Text Interpretation:  Sinus rhythm No STEIM.  Confirmed by Alona BeneLong, Joshua [phone]([id number]) on 07/25/2017 5:56:42 PM   Radiology Dg Chest 2 View  Result Date: 07/25/2017 CLINICAL DATA:  Weakness EXAM: CHEST - 2 VIEW COMPARISON:  05/27/2014 FINDINGS:  Cardiac shadow is within normal limits. The lungs are well aerated bilaterally. No focal infiltrate or sizable effusion is seen. No acute bony abnormality is noted. IMPRESSION: No active cardiopulmonary disease. Electronically Signed   By: Alcide CleverMark  Lukens M.D.   On: 07/25/2017 17:57   Ct Head Wo Contrast  Result Date: 07/25/2017 CLINICAL DATA:  Right-sided headache and right-sided weakness since yesterday. EXAM: CT HEAD WITHOUT CONTRAST TECHNIQUE: Contiguous axial images were obtained from the base of the skull through the vertex without intravenous contrast. COMPARISON:  Brain MR and head CT dated 06/27/2016. FINDINGS: Brain: Diffusely enlarged ventricles and subarachnoid spaces. Stable old left middle cerebral artery distribution the deep white matter and basal ganglia infarct with ex vacuo enlargement of the left lateral ventricle and midline shift to the left. No intracranial hemorrhage, mass lesion or CT evidence of acute infarction. Vascular: No hyperdense vessel or unexpected calcification. Skull: Normal. Negative for fracture or focal lesion. Sinuses/Orbits: Unremarkable. Other: None. IMPRESSION: 1. No acute abnormality. 2. Stable changes due to an old left middle cerebral artery distribution infarct. 3. Mild diffuse cerebral and cerebellar atrophy. Electronically Signed   By: Beckie SaltsSteven  Reid M.D.   On: 07/25/2017 16:34    Procedures Procedures (including critical care time)  Medications Ordered in ED Medications  amLODipine (NORVASC) tablet 5 mg (5 mg Oral Given 07/25/17 2127)  baclofen (LIORESAL) tablet 5 mg (5 mg Oral Not Given 07/25/17 2229)  cholecalciferol (VITAMIN D) tablet 1,000 Units (1,000 Units Oral Not Given 07/25/17 2110)  clopidogrel (PLAVIX) tablet 75 mg (75 mg Oral Given 07/25/17 2110)  levothyroxine (SYNTHROID, LEVOTHROID) tablet 125 mcg (has no administration in time range)  lisinopril (PRINIVIL,ZESTRIL) tablet 10 mg (has no administration in time range)  magnesium oxide (MAG-OX)  tablet 200 mg (200 mg Oral Given 07/25/17 2109)  0.9 %  sodium chloride infusion ( Intravenous New Bag/Given 07/25/17 2300)  senna-docusate (Senokot-S) tablet 1 tablet (has no administration in time range)  enoxaparin (LOVENOX) injection 40 mg (40 mg Subcutaneous Given 07/25/17 2300)  ondansetron (ZOFRAN) injection 4 mg (has no administration in time range)  hydrALAZINE (APRESOLINE) injection 5 mg (has no administration in time range)  zolpidem (AMBIEN) tablet 5 mg (has no administration in time range)  multivitamin with minerals tablet 1 tablet (has no administration in time range)  ibuprofen (ADVIL,MOTRIN) tablet 200 mg (  has no administration in time range)  HYDROcodone-acetaminophen (NORCO/VICODIN) 5-325 MG per tablet 1 tablet (has no administration in time range)  potassium chloride SA (K-DUR,KLOR-CON) CR tablet 40 mEq (40 mEq Oral Given 07/25/17 1740)   stroke: mapping our early stages of recovery book (1 each Does not apply Given 07/25/17 2111)     Initial Impression / Assessment and Plan / ED Course  I have reviewed the triage vital signs and the nursing notes.  Pertinent labs & imaging results that were available during my care of the patient were reviewed by me and considered in my medical decision making (see chart for details).     Patient with symptoms concerning for stroke.  CT head is negative.  Labs unremarkable, except mild hypokalemia, 3.3, and ALT 74.  Troponin is negative.  I spoke with tele-neurologist, Dr. Lyndee Leo, who advised admission for stroke workup including MRI/MRA head and neck, echocardiogram, lipid panel, A1c, and typical basic stroke workup.  Patient does have neuro deficits on my exam, however patient does have baseline right-sided deficit.  I spoke with [facility] hospitalist, Dr. Dartha Lodge, who accepts admission at [facility].  Patient will be transferred there via [facility].  Patient understands and agrees with plan.  Patient also evaluated by Dr. Jacqulyn Bath  who guided the patient's management and agrees with plan.  Final Clinical Impressions(s) / ED Diagnoses   Final diagnoses:  Abdominal pain  Weakness    ED Discharge Orders    None       Emi Holes, PA-C 07/26/17 0005    Maia Plan, MD 07/26/17 1134

## 2017-07-25 NOTE — ED Notes (Signed)
Patient transported to X-ray 

## 2017-07-26 ENCOUNTER — Inpatient Hospital Stay (HOSPITAL_COMMUNITY): Payer: Medicare HMO

## 2017-07-26 ENCOUNTER — Inpatient Hospital Stay (HOSPITAL_COMMUNITY): Payer: Medicare Other

## 2017-07-26 ENCOUNTER — Ambulatory Visit: Payer: Medicare HMO | Admitting: Physical Therapy

## 2017-07-26 DIAGNOSIS — R109 Unspecified abdominal pain: Secondary | ICD-10-CM

## 2017-07-26 DIAGNOSIS — I10 Essential (primary) hypertension: Secondary | ICD-10-CM

## 2017-07-26 DIAGNOSIS — I351 Nonrheumatic aortic (valve) insufficiency: Secondary | ICD-10-CM

## 2017-07-26 DIAGNOSIS — R531 Weakness: Secondary | ICD-10-CM

## 2017-07-26 DIAGNOSIS — E785 Hyperlipidemia, unspecified: Secondary | ICD-10-CM | POA: Diagnosis not present

## 2017-07-26 DIAGNOSIS — E039 Hypothyroidism, unspecified: Secondary | ICD-10-CM | POA: Diagnosis not present

## 2017-07-26 DIAGNOSIS — I639 Cerebral infarction, unspecified: Secondary | ICD-10-CM | POA: Diagnosis not present

## 2017-07-26 DIAGNOSIS — I69351 Hemiplegia and hemiparesis following cerebral infarction affecting right dominant side: Secondary | ICD-10-CM | POA: Diagnosis not present

## 2017-07-26 LAB — LIPID PANEL
CHOL/HDL RATIO: 2.8 ratio
Cholesterol: 158 mg/dL (ref 0–200)
HDL: 57 mg/dL (ref >40)
LDL CALC: 84 mg/dL (ref 0–99)
TRIGLYCERIDES: 84 mg/dL (ref <150)
VLDL: 17 mg/dL (ref 0–40)

## 2017-07-26 LAB — TSH: TSH: 6.225 u[IU]/mL — AB (ref 0.350–4.500)

## 2017-07-26 LAB — HEMOGLOBIN A1C
Hgb A1c MFr Bld: 5.5 % (ref 4.8–5.6)
Mean Plasma Glucose: 111.15 mg/dL

## 2017-07-26 LAB — HIV ANTIBODY (ROUTINE TESTING W REFLEX): HIV Screen 4th Generation wRfx: NONREACTIVE

## 2017-07-26 LAB — ECHOCARDIOGRAM COMPLETE
Height: 67 in
Weight: 2723.12 oz

## 2017-07-26 MED ORDER — IOPAMIDOL (ISOVUE-370) INJECTION 76%
INTRAVENOUS | Status: AC
  Administered 2017-07-26: 50 mL
  Filled 2017-07-26: qty 50

## 2017-07-26 NOTE — Evaluation (Signed)
Occupational Therapy Evaluation Patient Details Name: Kristin Mahoney MRN: [id number] DOB: 1955/10/22 Today's Date: 07/26/2017    History of Present Illness Kristin Mahoney is a 62 y.o. 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    Clinical Impression   PTA, pt was living with her husband and was performing ADLs. Pt currently performing near baseline function with supervision-Min guard A for safety. Pt continues to report numbness on R side of face and in fingers of R hand as well as R-side weakness. Pt requiring Min A for FM tasks such as opening mouth wash during grooming. Pt would benefit from further acute OT to address safe tub transfers and facilitate safe dc. Recommend dc home with continued OP OT to optimize functional performance and safety with ADLs.      Follow Up Recommendations  Outpatient OT;Supervision - Intermittent(Continue OP OT)    Equipment Recommendations  None recommended by OT    Recommendations for Other Services PT consult     Precautions / Restrictions Precautions Precautions: Fall Precaution Comments: R hemiparesis Restrictions Weight Bearing Restrictions: No      Mobility Bed Mobility Overal bed mobility: Modified Independent             General bed mobility comments: OOB upon arrival  Transfers Overall transfer level: Needs assistance Equipment used: Straight cane Transfers: Sit to/from Stand Sit to Stand: Min guard         General transfer comment: increased time, guarded but no physical assist needed    Balance Overall balance assessment: Needs assistance Sitting-balance support: Feet supported;No upper extremity supported Sitting balance-Leahy Scale: Fair Sitting balance - Comments: pt attempting to don bilat shoes, no episodes of LOB   Standing  balance support: Single extremity supported Standing balance-Leahy Scale: Fair Standing balance comment: requires cane for amb                           ADL either performed or assessed with clinical judgement   ADL Overall ADL's : Needs assistance/impaired     Grooming: Oral care;Set up;Supervision/safety;Sitting;Wash/dry hands;Standing;Minimal assistance Grooming Details (indicate cue type and reason): Pt performing grooming at sink while seated with seat up A. Required Min A for FM tasks to open mouth wash. Pt performing hand hygiene after toileting while standing at the sink with supervision for safety. Upper Body Bathing: Set up;Supervision/ safety;Sitting   Lower Body Bathing: Min guard;Sit to/from stand   Upper Body Dressing : Set up;Supervision/safety;Sitting Upper Body Dressing Details (indicate cue type and reason): Donning second gown like jacket. Able to tie gown strings without difficulty   Lower Body Dressing Details (indicate cue type and reason): Pt with tennis shoes donned upon arrival. PT reporting that pt required Min A for donning shoes due to non-slip socks Toilet Transfer: Supervision/safety;Ambulation;Regular Toilet   Toileting- Architect and Hygiene: Supervision/safety;Sit to/from Nurse, children's Details (indicate cue type and reason): Discussed safe tub transfer technique Functional mobility during ADLs: Min guard General ADL Comments: Pt performing ADLs and funcitonal mobility at [employer] level. Presenting near baseline funciton with reports increased R sided weakness compared to baseline. Pt also reporting numbness in right side of face and RUE.      Vision Baseline Vision/History: Wears glasses Wears Glasses: At all times Patient Visual Report: No change from baseline  Perception     Praxis      Pertinent Vitals/Pain Pain Assessment: Faces Faces Pain Scale: No hurt Pain Intervention(s):  Monitored during session     Hand Dominance Left   Extremity/Trunk Assessment Upper Extremity Assessment Upper Extremity Assessment: RUE deficits/detail RUE Deficits / Details: grip 3/5, tricep 3-/5, bicep 3/5, shld 3/5. Decreased finger opposition, FM skills, and punch strength as seen during ADLs and testing RUE Coordination: decreased fine motor;decreased gross motor   Lower Extremity Assessment Lower Extremity Assessment: Defer to PT evaluation RLE Deficits / Details: grossly 3-/5   Cervical / Trunk Assessment Cervical / Trunk Assessment: Normal   Communication Communication Communication: Expressive difficulties(delayed but coherent)   Cognition Arousal/Alertness: Awake/alert Behavior During Therapy: WFL for tasks assessed/performed Overall Cognitive Status: Within Functional Limits for tasks assessed                                 General Comments: WFL for tasks completes. Pt reports she feels at baseline.    General Comments  Significant other present throughout session    Exercises     Shoulder Instructions      Home Living Family/patient expects to be discharged to:: Private residence Living Arrangements: Spouse/significant other Available Help at Discharge: Family;Available 24 hours/day Type of Home: House Home Access: Stairs to enter [employer] of Steps: 4 Entrance Stairs-Rails: Can reach both Home Layout: One level     Bathroom Shower/Tub: Chief Strategy Officer: Standard     Home Equipment: Cane - single point;Shower seat;Grab bars - tub/shower          Prior Functioning/Environment Level of Independence: Independent with assistive device(s)        Comments: uses SPC, doesn't drive. Performs her ADLs. Enjoys reading and going to church        OT Problem List: Decreased strength;Impaired balance (sitting and/or standing);Decreased range of motion;Decreased coordination;Decreased knowledge of  precautions;Impaired UE functional use      OT Treatment/Interventions: Self-care/ADL training;Therapeutic exercise;Energy conservation;DME and/or AE instruction;Therapeutic activities;Patient/family education    OT Goals(Current goals can be found in the care plan section) Acute Rehab OT Goals Patient Stated Goal: get back to baseline OT Goal Formulation: With patient Time For Goal Achievement: 08/09/17 Potential to Achieve Goals: Good ADL Goals Pt Will Perform Tub/Shower Transfer: with supervision;shower seat;ambulating Additional ADL Goal #1: Pt will perform ADLs at Mod I level with increased time as needed  OT Frequency: Min 2X/week   Barriers to D/C:            Co-evaluation              AM-PAC PT "6 Clicks" Daily Activity     Outcome Measure Help from another person eating meals?: None Help from another person taking care of personal grooming?: A Little Help from another person toileting, which includes using toliet, bedpan, or urinal?: A Little Help from another person bathing (including washing, rinsing, drying)?: A Little Help from another person to put on and taking off regular upper body clothing?: None Help from another person to put on and taking off regular lower body clothing?: A Little 6 Click Score: 20   End of Session Equipment Utilized During Treatment: Other (comment)(Cane) Nurse Communication: Mobility status  Activity Tolerance: Patient tolerated treatment well Patient left: in chair;with call bell/phone within reach(with transport team)  OT Visit Diagnosis: Unsteadiness on feet (R26.81);Other abnormalities of gait and mobility (R26.89);Muscle weakness (  generalized) (M62.81)                Time: 4098-1191: 0806-0823 OT Time Calculation (min): 17 min Charges:  OT General Charges $OT Visit: 1 Visit OT Evaluation $OT Eval Low Complexity: 1 Low G-Codes:     Kristin Mahoney MSOT, OTR/L Acute Rehab Pager: [phone] Office: [phone]  Kristin Mahoney  Kristin Mahoney 07/26/2017, 8:39 AM

## 2017-07-26 NOTE — Evaluation (Signed)
Speech Language Pathology Evaluation Patient Details Name: Kristin Mahoney MRN: [id number] DOB: 12/16/1955 Today's Date: 07/26/2017 Time: 1335-1400 SLP Time Calculation (min) (ACUTE ONLY): 25 min  Problem List:  Patient Active Problem List   Diagnosis Date Noted  . Abdominal pain   . Weakness   . CVA (cerebral vascular accident) (HCC) 07/25/2017  . Right sided weakness 07/25/2017  . Hypokalemia 07/25/2017  . Neck pain on right side 07/25/2017  . Right-sided face pain 07/25/2017  . RUQ abdominal pain 07/25/2017  . Hypertension   . HLD (hyperlipidemia)   . Hypothyroidism    Past Medical History:  Past Medical History:  Diagnosis Date  . HLD (hyperlipidemia)   . Hypertension   . Hypothyroidism   . Stroke (HCC)   . Thyroid disease    Past Surgical History:  Past Surgical History:  Procedure Laterality Date  . APPENDECTOMY     HPI:  Kristin Mahoney is a 62 y.o. 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    Assessment / Plan / Recommendation Clinical Impression  Patient came to hospital for increased right sided weakness. Patient's husband was at bedside during evaluation and able to give important historical information. Cogn/language evaluation revealed difficulties with visuospatial task, memory deficits and mild attentional deficits. Patient has a history of stroke and husband reports these are all deficits she has had since stroke in 2011. Patient lives at home with husband who is available 24 hours a day. With no new deficits found, speech therapy is not recommended.     SLP Assessment  SLP Recommendation/Assessment: Patient does not need any further Speech Lanaguage Pathology Services SLP Visit Diagnosis: Cognitive communication deficit (R41.841)    Follow Up Recommendations  None                SLP Evaluation Cognition  Overall Cognitive Status: Within Functional Limits for tasks assessed Arousal/Alertness: Awake/alert Orientation Level: Oriented X4 Attention: Focused Focused Attention: Appears intact Memory: Impaired Memory Impairment: Decreased recall of new information Awareness: Appears intact Problem Solving: Appears intact Safety/Judgment: Appears intact       Comprehension  Auditory Comprehension Overall Auditory Comprehension: Appears within functional limits for tasks assessed Yes/No Questions: Within Functional Limits Commands: Within Functional Limits Conversation: Simple Visual Recognition/Discrimination Discrimination: Within Function Limits Reading Comprehension Reading Status: Within funtional limits    Expression Expression Primary Mode of Expression: Verbal Verbal Expression Overall Verbal Expression: Appears within functional limits for tasks assessed Initiation: No impairment Written Expression Dominant Hand: Left Written Expression: Within Functional Limits   Oral / Motor  Oral Motor/Sensory Function Overall Oral Motor/Sensory Function: Within functional limits Motor Speech Overall Motor Speech: Appears within functional limits for tasks assessed Respiration: Within functional limits Phonation: Normal Resonance: Within functional limits Articulation: Within functional limitis Intelligibility: Intelligible Motor Planning: Witnin functional limits Motor Speech Errors: Not applicable   GO                    Lindalou HoseSarah J Allayah Raineri 07/26/2017, 2:30 PM

## 2017-07-26 NOTE — Progress Notes (Signed)
Pt discharge education and instructions completed with pt and spouse at beside; both voices understanding and denies any questions. Pt IV and telemetry removed; pt discharge home with spouse to transport her home. Pt transported off unit via wheelchair with belongings and spouse to the side. Dionne BucyP. Amo Shreshta Medley RN

## 2017-07-26 NOTE — Care Management Note (Signed)
Case Management Note  Patient Details  Name: Cherlynn KaiserCatherine Okey MRN: [id number] Date of Birth: 10/14/1955  Subjective/Objective:                    Action/Plan: [facility] Pt was active with [facility] prior to admission and she wants to continue with their services. CM notified MD. Orders placed in Epic and information on the AVS. Pts spouse to provide transportation home.    Expected Discharge Date:  07/26/17               Expected Discharge Plan:  OP Rehab  In-House Referral:     Discharge planning Services  CM Consult  Post Acute Care Choice:    Choice offered to:     DME Arranged:    DME Agency:     HH Arranged:    HH Agency:     Status of Service:  Completed, signed off  If discussed at [employer] Length of Stay Meetings, dates discussed:    Additional Comments:  Kermit BaloKelli F Yer Castello, RN 07/26/2017, 3:46 PM

## 2017-07-26 NOTE — Discharge Summary (Signed)
Physician Discharge Summary  Kristin Mahoney [id number] DOB: 01/17/1956 DOA: 07/25/2017  PCP: Dennard SchaumannParuchuri, Vamsee, MD  Admit date: 07/25/2017 Discharge date: 07/26/2017  Admitted From: Home Disposition:  Home  Recommendations for Outpatient Follow-up:  1. Follow up with PCP in 1 week 2. 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  Initial CAT scan was negative for acute abnormality.  Teleneurology recommended MRI of the brain.  MRI of the brain was negative for acute stroke.  Neurology evaluated the patient and cleared has cleared the patient for discharge.  Discharge Diagnoses:  Principal Problem:   Right sided weakness Active Problems:   CVA (cerebral vascular accident) (HCC)   Hypertension   HLD (hyperlipidemia)   Hypothyroidism   Hypokalemia   Neck pain on right side   Right-sided face pain   RUQ abdominal pain   Abdominal pain   Weakness  Right sided weakness with pain in right neck, head and face -Symptoms are improving.  -Initial CT of the head was negative for acute intracranial abnormality.  MRI of the brain was negative for stroke.  CT angiogram of the head and neck was negative -Neurology evaluation appreciated.  Outpatient follow-up with neurology -Might need to increase the dose of baclofen as an outpatient if spasticity persists -Continue outpatient PT/OT -Tolerating diet -Discharge patient home today   Hx of stroke with residual right-sided weakness -Continue Plavix and Lipitor.  Outpatient follow-up with neurology  HTN:  -Continue home medications:for amlodipine, lisinopril  HLD  (hyperlipidemia) -Continue Lipitor  Hypothyroidism: Last TSH was 29.5 on 05/03/15 -Continue home Synthroid   Very slightly elevated ALT -Right upper quadrant ultrasound was negative for gallstones.  Outpatient follow-up -Resume statin on discharge    Discharge Instructions  Discharge Instructions    Ambulatory referral to Neurology   Complete by:  As directed    An appointment is requested in approximately: 1-2 weeks   Ambulatory referral to Occupational Therapy   Complete by:  As directed    Ambulatory referral to Physical Therapy   Complete by:  As directed    Per MD patient is cleared to continue outpatient therapy   Call MD for:  difficulty breathing, headache or visual disturbances   Complete by:  As directed    Call MD for:  extreme fatigue   Complete by:  As directed    Call MD for:  hives   Complete by:  As directed    Call MD for:  persistant dizziness or light-headedness   Complete by:  As directed    Call MD for:  persistant nausea and vomiting   Complete by:  As directed    Call MD for:  severe uncontrolled pain   Complete by:  As directed    Call MD for:  temperature >100.4   Complete by:  As directed    Diet - low sodium heart healthy   Complete by:  As directed    Increase activity slowly   Complete by:  As directed      Allergies as of 07/26/2017   No Known Allergies     Medication List    TAKE these medications   amLODipine 5 MG tablet Commonly known as:  NORVASC Take 5 mg by  mouth daily.   atorvastatin 20 MG tablet Commonly known as:  LIPITOR Take 20 mg by mouth daily.   baclofen 10 MG tablet Commonly known as:  LIORESAL Take 5 mg by mouth 2 (two) times daily. Reported on 07/17/2015   CENTRUM SILVER ADULT 50+ PO Take 1 tablet by mouth daily.   clopidogrel 75 MG tablet Commonly known as:  PLAVIX Take 75 mg by mouth daily.   HYDROcodone-acetaminophen 10-325 MG tablet Commonly known as:  NORCO Take 1 tablet by mouth every 6 (six)  hours as needed.   levothyroxine 125 MCG tablet Commonly known as:  SYNTHROID, LEVOTHROID Take 125 mcg by mouth daily.   lisinopril 10 MG tablet Commonly known as:  PRINIVIL,ZESTRIL Take 10 mg by mouth daily.   Magnesium 250 MG Tabs Take 250 mg by mouth daily.   predniSONE 5 MG tablet Commonly known as:  DELTASONE Take 5 mg by mouth 2 (two) times daily with a meal.   SYMBICORT 160-4.5 MCG/ACT inhaler Generic drug:  budesonide-formoterol Inhale 1 puff into the lungs 2 (two) times daily as needed for shortness of breath.   VITAMIN D PO Take 1 tablet by mouth daily.      Follow-up Information    [facility] Follow up.   Specialty:  Rehabilitation Why:  orders resent. You may call and set up next visit. Contact information: [address] [id number] [place] [address] [phone]       Dennard Schaumann, MD. Schedule an appointment as soon as possible for a visit in 1 week(s).   Specialty:  Internal Medicine         No Known Allergies  Consultations:  Neurology   Procedures/Studies: Ct Angio Head W Or Wo Contrast  Result Date: 07/26/2017 CLINICAL DATA:  Right-sided weakness. EXAM: CT ANGIOGRAPHY HEAD AND NECK TECHNIQUE: Multidetector CT imaging of the head and neck was performed using the standard protocol during bolus administration of intravenous contrast. Multiplanar CT image reconstructions and MIPs were obtained to evaluate the vascular anatomy. Carotid stenosis measurements (when applicable) are obtained utilizing NASCET criteria, using the distal internal carotid diameter as the denominator. CONTRAST:  50mL ISOVUE-370 IOPAMIDOL (ISOVUE-370) INJECTION 76% COMPARISON:  Head CT 07/25/2017 FINDINGS: CTA NECK FINDINGS Aortic arch: There is mild calcific atherosclerosis of the aortic arch. There is no aneurysm, dissection or hemodynamically significant stenosis of the visualized ascending aorta  and aortic arch. Conventional 3 vessel aortic branching pattern. The visualized proximal subclavian arteries are widely patent. Right carotid system: --Common carotid artery: Widely patent origin without common carotid artery dissection or aneurysm. --Internal carotid artery: No dissection, occlusion or aneurysm. No hemodynamically significant stenosis. --External carotid artery: No acute abnormality. Left carotid system: --Common carotid artery: Widely patent origin without common carotid artery dissection or aneurysm. --Internal carotid artery:No dissection, occlusion or aneurysm. No hemodynamically significant stenosis. --External carotid artery: No acute abnormality. Vertebral arteries: Right dominant configuration. Both origins are normal. No dissection, occlusion or flow-limiting stenosis to the vertebrobasilar confluence. Skeleton: There is no bony spinal canal stenosis. No lytic or blastic lesion. Other neck: 11 mm left thyroid nodule. Upper chest: Biapical bullae. CTA HEAD FINDINGS Anterior circulation: --Intracranial internal carotid arteries: Atherosclerotic calcification of the internal carotid arteries at the skull base without hemodynamically significant stenosis. --Anterior cerebral arteries: Normal. Absent right A1 segment, normal variant --Middle cerebral arteries: Normal. --Posterior communicating arteries: Absent bilaterally. Posterior circulation: --Basilar artery: Normal. --Posterior cerebral arteries: Normal. --Superior cerebellar arteries: Normal. --Inferior cerebellar arteries: Normal anterior  and posterior inferior cerebellar arteries. Venous sinuses: As permitted by contrast timing, patent. 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 Delayed phase: No parenchymal contrast enhancement. Review of the MIP images confirms the above findings. IMPRESSION: No emergent large vessel occlusion or hemodynamically significant stenosis of the arteries of the head  and neck. Electronically Signed   By: Deatra Robinson M.D.   On: 07/26/2017 01:13   Dg Chest 2 View  Result Date: 07/25/2017 CLINICAL DATA:  Weakness EXAM: CHEST - 2 VIEW COMPARISON:  05/27/2014 FINDINGS: Cardiac shadow is within normal limits. The lungs are well aerated bilaterally. No focal infiltrate or sizable effusion is seen. No acute bony abnormality is noted. IMPRESSION: No active cardiopulmonary disease. Electronically Signed   By: Alcide Clever M.D.   On: 07/25/2017 17:57   Ct Head Wo Contrast  Result Date: 07/25/2017 CLINICAL DATA:  Right-sided headache and right-sided weakness since yesterday. EXAM: CT HEAD WITHOUT CONTRAST TECHNIQUE: Contiguous axial images were obtained from the base of the skull through the vertex without intravenous contrast. COMPARISON:  Brain MR and head CT dated 06/27/2016. FINDINGS: Brain: Diffusely enlarged ventricles and subarachnoid spaces. Stable old left middle cerebral artery distribution the deep white matter and basal ganglia infarct with ex vacuo enlargement of the left lateral ventricle and midline shift to the left. No intracranial hemorrhage, mass lesion or CT evidence of acute infarction. Vascular: No hyperdense vessel or unexpected calcification. Skull: Normal. Negative for fracture or focal lesion. Sinuses/Orbits: Unremarkable. Other: None. IMPRESSION: 1. No acute abnormality. 2. Stable changes due to an old left middle cerebral artery distribution infarct. 3. Mild diffuse cerebral and cerebellar atrophy. Electronically Signed   By: Beckie Salts M.D.   On: 07/25/2017 16:34   Ct Angio Neck W Or Wo Contrast  Result Date: 07/26/2017 CLINICAL DATA:  Right-sided weakness. EXAM: CT ANGIOGRAPHY HEAD AND NECK TECHNIQUE: Multidetector CT imaging of the head and neck was performed using the standard protocol during bolus administration of intravenous contrast. Multiplanar CT image reconstructions and MIPs were obtained to evaluate the vascular anatomy. Carotid  stenosis measurements (when applicable) are obtained utilizing NASCET criteria, using the distal internal carotid diameter as the denominator. CONTRAST:  50mL ISOVUE-370 IOPAMIDOL (ISOVUE-370) INJECTION 76% COMPARISON:  Head CT 07/25/2017 FINDINGS: CTA NECK FINDINGS Aortic arch: There is mild calcific atherosclerosis of the aortic arch. There is no aneurysm, dissection or hemodynamically significant stenosis of the visualized ascending aorta and aortic arch. Conventional 3 vessel aortic branching pattern. The visualized proximal subclavian arteries are widely patent. Right carotid system: --Common carotid artery: Widely patent origin without common carotid artery dissection or aneurysm. --Internal carotid artery: No dissection, occlusion or aneurysm. No hemodynamically significant stenosis. --External carotid artery: No acute abnormality. Left carotid system: --Common carotid artery: Widely patent origin without common carotid artery dissection or aneurysm. --Internal carotid artery:No dissection, occlusion or aneurysm. No hemodynamically significant stenosis. --External carotid artery: No acute abnormality. Vertebral arteries: Right dominant configuration. Both origins are normal. No dissection, occlusion or flow-limiting stenosis to the vertebrobasilar confluence. Skeleton: There is no bony spinal canal stenosis. No lytic or blastic lesion. Other neck: 11 mm left thyroid nodule. Upper chest: Biapical bullae. CTA HEAD FINDINGS Anterior circulation: --Intracranial internal carotid arteries: Atherosclerotic calcification of the internal carotid arteries at the skull base without hemodynamically significant stenosis. --Anterior cerebral arteries: Normal. Absent right A1 segment, normal variant --Middle cerebral arteries: Normal. --Posterior communicating arteries: Absent bilaterally. Posterior circulation: --Basilar artery: Normal. --Posterior cerebral arteries: Normal. --Superior cerebellar arteries:  Normal.  --Inferior cerebellar arteries: Normal anterior and posterior inferior cerebellar arteries. Venous sinuses: As permitted by contrast timing, patent. 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 Delayed phase: No parenchymal contrast enhancement. Review of the MIP images confirms the above findings. IMPRESSION: No emergent large vessel occlusion or hemodynamically significant stenosis of the arteries of the head and neck. Electronically Signed   By: Deatra Robinson M.D.   On: 07/26/2017 01:13   Mr Brain Wo Contrast  Result Date: 07/26/2017 CLINICAL DATA:  Stroke follow-up.  Right-sided weakness. EXAM: MRI HEAD WITHOUT CONTRAST TECHNIQUE: Multiplanar, multiecho pulse sequences of the brain and surrounding structures were obtained without intravenous contrast. COMPARISON:  CTA head neck 07/26/2017 FINDINGS: Brain: The midline structures are normal. There is no acute infarct or acute hemorrhage. No mass lesion, hydrocephalus, dural abnormality or extra-axial collection. Old left insula and corona radiata infarct. No age-advanced or lobar predominant atrophy. No chronic microhemorrhage or superficial siderosis. Vascular: Major intracranial arterial and venous sinus flow voids are preserved. Skull and upper cervical spine: The visualized skull base, calvarium, upper cervical spine and extracranial soft tissues are normal. Sinuses/Orbits: Small amount of left mastoid fluid.  Normal orbits. IMPRESSION: Old right MCA infarct involving the left insula and corona radiata. No acute abnormality. Electronically Signed   By: Deatra Robinson M.D.   On: 07/26/2017 01:55   US Abdomen Limited Ruq  Result Date: 07/26/2017 CLINICAL DATA:  Abdominal pain EXAM: ULTRASOUND ABDOMEN LIMITED RIGHT UPPER QUADRANT COMPARISON:  CT abdomen 05/01/2017 FINDINGS: Gallbladder: No gallstones or wall thickening visualized. No sonographic Murphy sign noted by sonographer. Common bile duct: Diameter: 2.6 mm Liver:  1 cm anechoic left hepatic mass most consistent with a cyst small cyst unchanged compared with prior CT abdomen dated 05/01/2017. Within normal limits in parenchymal echogenicity. Portal vein is patent on color Doppler imaging with normal direction of blood flow towards the liver. IMPRESSION: 1. No cholelithiasis or sonographic evidence of acute cholecystitis. 2. No focal hepatic abnormality. Electronically Signed   By: Elige Ko   On: 07/26/2017 09:39     Subjective: At bedside.  She still complains of some right facial numbness and weakness of the right side but slightly improved.  No overnight fever or vomiting.  Discharge Exam: Vitals:   07/26/17 0800 07/26/17 1200  BP: 122/74 120/69  Pulse: 66 70  Resp: 16 16  Temp: 98.5 F (36.9 C) 98.4 F (36.9 C)  SpO2: 98% 98%   Vitals:   07/26/17 0200 07/26/17 0414 07/26/17 0800 07/26/17 1200  BP: 116/76 129/82 122/74 120/69  Pulse: 68 62 66 70  Resp: 16 16 16 16   Temp: 98.8 F (37.1 C) 98.5 F (36.9 C) 98.5 F (36.9 C) 98.4 F (36.9 C)  TempSrc: Oral Oral Oral Oral  SpO2: 99% 97% 98% 98%  Weight:      Height:        General: Pt is alert, awake, not in acute distress Cardiovascular: Rate controlled, S1/S2 + Respiratory: Bilateral decreased breath sounds at bases Abdominal: Soft, NT, ND, bowel sounds + Extremities: no edema, no cyanosis Neuro: Mild weakness in the right upper and lower extremity    The results of significant diagnostics from this hospitalization (including imaging, microbiology, ancillary and laboratory) are listed below for reference.     Microbiology: No results found for this or any previous visit (from the past 240 hour(s)).   Labs: BNP (last 3 results) No results for input(s): BNP in the last 8760 hours.  Basic Metabolic Panel: Recent Labs  Lab 07/25/17 1558 07/25/17 2109  NA 136  --   K 3.3*  --   CL 106  --   CO2 21*  --   GLUCOSE 131*  --   BUN 18  --   CREATININE 0.82  --   CALCIUM  8.8*  --   MG  --  2.3   Liver Function Tests: Recent Labs  Lab 07/25/17 1558  AST 35  ALT 74*  ALKPHOS 62  BILITOT 0.5  PROT 7.2  ALBUMIN 4.0   Recent Labs  Lab 07/25/17 2109  LIPASE 38   No results for input(s): AMMONIA in the last 168 hours. CBC: Recent Labs  Lab 07/25/17 1558  WBC 8.7  HGB 12.9  HCT 39.2  MCV 95.1  PLT 235   Cardiac Enzymes: Recent Labs  Lab 07/25/17 1558  TROPONINI <0.03   BNP: Invalid input(s): POCBNP CBG: Recent Labs  Lab 07/25/17 1528  GLUCAP 130*   D-Dimer No results for input(s): DDIMER in the last 72 hours. Hgb A1c Recent Labs    07/26/17 0606  HGBA1C 5.5   Lipid Profile Recent Labs    07/26/17 0606  CHOL 158  HDL 57  LDLCALC 84  TRIG 84  CHOLHDL 2.8   Thyroid function studies Recent Labs    07/26/17 0606  TSH 6.225*   Anemia work up No results for input(s): VITAMINB12, FOLATE, FERRITIN, TIBC, IRON, RETICCTPCT in the last 72 hours. Urinalysis    Component Value Date/Time   COLORURINE YELLOW 07/25/2017 1725   APPEARANCEUR CLEAR 07/25/2017 1725   LABSPEC 1.015 07/25/2017 1725   PHURINE 6.5 07/25/2017 1725   GLUCOSEU NEGATIVE 07/25/2017 1725   HGBUR TRACE (A) 07/25/2017 1725   BILIRUBINUR NEGATIVE 07/25/2017 1725   KETONESUR NEGATIVE 07/25/2017 1725   PROTEINUR NEGATIVE 07/25/2017 1725   NITRITE NEGATIVE 07/25/2017 1725   LEUKOCYTESUR TRACE (A) 07/25/2017 1725   Sepsis Labs Invalid input(s): PROCALCITONIN,  WBC,  LACTICIDVEN Microbiology No results found for this or any previous visit (from the past 240 hour(s)).   Time coordinating discharge: 35 minutes  SIGNED:   Glade Lloyd, MD  [facility] 07/26/2017, 3:33 PM Pager: [phone]  If 7PM-7AM, please contact night-coverage [email or web address] Password [email or web address]

## 2017-07-26 NOTE — Progress Notes (Signed)
Skin assessment done by this nurse and Maryruth Evelaire RN. ON head to toe assessment no pressure ulcer noted.

## 2017-07-26 NOTE — Progress Notes (Signed)
  Echocardiogram 2D Echocardiogram has been performed.  Delcie RochENNINGTON, Arpi Diebold 07/26/2017, 5:00 PM

## 2017-07-26 NOTE — Evaluation (Signed)
Physical Therapy Evaluation Patient Details Name: Kristin Mahoney MRN: [id number] DOB: 07/21/1955 Today's Date: 07/26/2017   History of Present Illness  Kristin KaiserCatherine Mahoney is a 62 y.o. 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   Clinical Impression  Pt functioning near baseline despite worsening of R sided weakness. Pt functioning at min guard/minA level with mild deconditioning/decreased activity tolerance. Pt with good home set up and support. Recommend pt to con't at outpt neuro rehab. Acute PT to con't to follow.    Follow Up Recommendations Outpatient PT;Supervision/Assistance - 24 hour(cont with outpt PT)    Equipment Recommendations  None recommended by PT    Recommendations for Other Services       Precautions / Restrictions Precautions Precautions: Fall Precaution Comments: R hemiparesis Restrictions Weight Bearing Restrictions: No      Mobility  Bed Mobility Overal bed mobility: Modified Independent             General bed mobility comments: hob elevated, used bed rail but no physical assist needed  Transfers Overall transfer level: Needs assistance Equipment used: Straight cane Transfers: Sit to/from Stand Sit to Stand: Min guard         General transfer comment: increased time, guarded but no physical assist needed  Ambulation/Gait Ambulation/Gait assistance: Min guard;Min assist Ambulation Distance (Feet): 100 Feet Assistive device: Straight cane Gait Pattern/deviations: Step-to pattern;Decreased stride length;Decreased weight shift to right;Decreased dorsiflexion - right;Narrow base of support Gait velocity: slow Gait velocity interpretation: Below normal speed for age/gender General Gait Details: pt requires use of shoes for increased stabilty. Pt minA for turning due  to instability. pt with good sequencing of cane in L hand with R LE. pt with increased difficulty clearing R foot with onset of fatigue  Stairs            Wheelchair Mobility    Modified Rankin (Stroke Patients Only) Modified Rankin (Stroke Patients Only) Pre-Morbid Rankin Score: Moderately severe disability Modified Rankin: Moderately severe disability     Balance Overall balance assessment: Needs assistance Sitting-balance support: Feet supported;No upper extremity supported Sitting balance-Leahy Scale: Fair Sitting balance - Comments: pt attempting to don bilat shoes, no episodes of LOB   Standing balance support: Single extremity supported Standing balance-Leahy Scale: Fair Standing balance comment: requires cane for amb                             Pertinent Vitals/Pain Pain Assessment: Faces Faces Pain Scale: No hurt    Home Living Family/patient expects to be discharged to:: Private residence Living Arrangements: Spouse/significant other Available Help at Discharge: Family;Available 24 hours/day Type of Home: House Home Access: Stairs to enter Entrance Stairs-Rails: Can reach both Entrance Stairs-Number of Steps: 4 Home Layout: One level Home Equipment: Cane - single point      Prior Function Level of Independence: Independent with assistive device(s)         Comments: uses [facility]PC, doesn't drive     Hand Dominance   Dominant Hand: Left    Extremity/Trunk Assessment   Upper Extremity Assessment Upper Extremity Assessment: RUE deficits/detail RUE Deficits / Details: grip 3/5, tricep 3-/5, bicep 3/5, shld 3/5    Lower Extremity Assessment Lower Extremity Assessment: RLE deficits/detail RLE Deficits / Details: grossly 3-/5    Cervical / Trunk Assessment Cervical / Trunk Assessment:  Normal  Communication   Communication: Expressive difficulties(delayed but coherent)  Cognition Arousal/Alertness: Awake/alert Behavior During Therapy:  WFL for tasks assessed/performed Overall Cognitive Status: Within Functional Limits for tasks assessed                                        General Comments      Exercises     Assessment/Plan    PT Assessment Patient needs continued PT services  PT Problem List Decreased strength;Decreased range of motion;Decreased activity tolerance;Decreased balance;Decreased mobility;Decreased coordination;Decreased cognition;Decreased knowledge of use of DME;Decreased safety awareness;Decreased knowledge of precautions       PT Treatment Interventions DME instruction;Gait training;Stair training;Functional mobility training;Therapeutic activities;Therapeutic exercise;Balance training;Neuromuscular re-education    PT Goals (Current goals can be found in the Care Plan section)  Acute Rehab PT Goals Patient Stated Goal: get back to baseline PT Goal Formulation: With patient Time For Goal Achievement: 08/02/17 Potential to Achieve Goals: Good    Frequency Min 4X/week   Barriers to discharge        Co-evaluation               AM-PAC PT "6 Clicks" Daily Activity  Outcome Measure Difficulty turning over in bed (including adjusting bedclothes, sheets and blankets)?: A Little Difficulty moving from lying on back to sitting on the side of the bed? : A Little Difficulty sitting down on and standing up from a chair with arms (e.g., wheelchair, bedside commode, etc,.)?: A Little Help needed moving to and from a bed to chair (including a wheelchair)?: A Little Help needed walking in hospital room?: A Little Help needed climbing 3-5 steps with a railing? : A Little 6 Click Score: 18    End of Session Equipment Utilized During Treatment: Gait belt Activity Tolerance: Patient tolerated treatment well Patient left: in chair;with nursing/sitter in room;with family/visitor present(at sink to get cleaned up) Nurse Communication: Mobility status PT Visit Diagnosis: Unsteadiness  on feet (R26.81);Hemiplegia and hemiparesis Hemiplegia - Right/Left: Right Hemiplegia - dominant/non-dominant: Non-dominant Hemiplegia - caused by: Cerebral infarction    Time: 0731-0753 PT Time Calculation (min) (ACUTE ONLY): 22 min   Charges:   PT Evaluation $PT Eval Moderate Complexity: 1 Mod     PT G Codes:        Lewis Shock, PT, DPT Pager #: [phone] Office #: [phone]   Kardell Virgil M Brinnley Lacap 07/26/2017, 8:08 AM

## 2017-07-26 NOTE — Consult Note (Addendum)
NEUROHOSPITALISTS-CONSULTATION NOTE    Requesting Physician:  Hanley Ben    Admit date: 07/26/2017    Chief Complaint:  Increased right-sided weakness   History obtained from:  Patient and Chart      HPI   Kristin Mahoney is a pleasant 62 year old female who is left-handed.  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  Looking through care everywhere patient has been receiving physical therapy and occupational therapy through [facility] since 2018.  Currently patient is on Plavix 75 mg daily, prednisone 5 mg twice daily, lisinopril for hypertension, Synthroid for hypothyroidism, baclofen 10 mg one half tab 2 times a day for muscle spasms.  Again she receives her medical care from neurology at [facility].  Patient comes to [facility] secondary to feeling as though she has had decreased strength in her right arm and leg along with increased tone in her right arm and leg.  And does state that she is frustrated with physical therapy and wants to stop physical therapy due to her frustrations.  As far as onset of symptoms patient states that these symptoms started a few days ago however she did not come in to the hospital however due to the unresolving of symptoms she did come in finally on 324 of 2019.  MRI brain did not show any acute infarct as dictated below.      Past Medical History    Past Medical History:  Diagnosis Date  . HLD (hyperlipidemia)   . Hypertension   . Hypothyroidism   . Stroke (HCC)   . Thyroid disease      Past Surgical History   Past Surgical History:  Procedure Laterality Date  . APPENDECTOMY       Family History   Family History  Problem Relation Age of Onset  . Hypertension Mother   . Stroke Mother   . Heart disease Mother   . Hypertension Sister   . Pancreatic cancer Brother     Social History    Social History:  reports that she has quit smoking. She has never used smokeless tobacco. Her alcohol and drug histories are not on file.   Allergies  Allergies: No Known Allergies   Medications Prior to Admission    Prior to Admission:  Medications Prior to Admission  Medication Sig Dispense Refill Last Dose  . amLODipine (NORVASC) 5 MG tablet Take 5 mg by mouth daily.  3 Past Week at Unknown time  . atorvastatin (LIPITOR) 20 MG tablet Take 20 mg by mouth daily.  11 Past Week at Unknown time  . baclofen (LIORESAL) 10 MG tablet Take 5 mg by mouth 2 (two) times daily. Reported on 07/17/2015   Past Week at Unknown time  . budesonide-formoterol (SYMBICORT) 160-4.5 MCG/ACT inhaler Inhale 1 puff into the lungs 2 (two) times daily as needed for shortness of breath.   Past Month at Unknown time  . Cholecalciferol (VITAMIN D PO) Take 1 tablet by mouth daily.    Past Week at Unknown time  . clopidogrel (PLAVIX) 75 MG tablet Take 75 mg by mouth daily.  1 Past Week at Unknown time  . HYDROcodone-acetaminophen (NORCO) 10-325 MG tablet Take 1 tablet by mouth every 6 (six) hours as needed.   Past Week at Unknown time  . levothyroxine (SYNTHROID, LEVOTHROID) 125 MCG tablet Take 125 mcg by mouth daily.   Past Week at Unknown time  .  lisinopril (PRINIVIL,ZESTRIL) 10 MG tablet Take 10 mg by mouth daily.   Past Week at Unknown time  . Magnesium 250 MG TABS Take 250 mg by mouth daily.    Past Week at Unknown time  . Multiple Vitamins-Minerals (CENTRUM SILVER ADULT 50+ PO) Take 1 tablet by mouth daily.   Past Week at Unknown time  . predniSONE (DELTASONE) 5 MG tablet Take 5 mg by mouth 2 (two) times daily with a meal.   Not Taking at Unknown time   Scheduled: . amLODipine  5 mg Oral Daily  . baclofen  5 mg Oral BID  . cholecalciferol  1,000 Units Oral Daily  . clopidogrel  75 mg Oral Daily  . enoxaparin (LOVENOX) injection  40 mg Subcutaneous Q24H  . levothyroxine  125 mcg Oral QAC breakfast  . lisinopril   10 mg Oral Daily  . magnesium oxide  200 mg Oral Daily  . multivitamin with minerals  1 tablet Oral Daily   Continuous: . sodium chloride 75 mL/hr at 07/26/17 0200     ROS  History obtained from the patient General ROS: negative for - chills, fatigue, fever, night sweats, weight gain or weight loss Psychological ROS: negative for - behavioral disorder, hallucinations, memory difficulties, mood swings or suicidal ideation Ophthalmic ROS: negative for - blurry vision, double vision, eye pain or loss of vision ENT ROS: negative for - epistaxis, nasal discharge, oral lesions, sore throat, tinnitus or vertigo Allergy and Immunology ROS: negative for - hives or itchy/watery eyes Hematological and Lymphatic ROS: negative for - bleeding problems, bruising or swollen lymph nodes Endocrine ROS: negative for - galactorrhea, hair pattern changes, polydipsia/polyuria or temperature intolerance Respiratory ROS: negative for - cough, hemoptysis, shortness of breath or wheezing Cardiovascular ROS: negative for - chest pain, dyspnea on exertion, edema or irregular heartbeat Gastrointestinal ROS: negative for - abdominal pain, diarrhea, hematemesis, nausea/vomiting or stool incontinence Genito-Urinary ROS: negative for - dysuria, hematuria, incontinence or urinary frequency/urgency Musculoskeletal ROS: Positive for -weakness and increased tone on the right upper and lower extremities Neurological ROS: as noted in HPI Dermatological ROS: negative for rash and skin lesion changes   Physical Examination          Vitals:    07/25/17 1445 07/25/17 1515 07/25/17 1545 07/25/17 1600  BP: 103/68 (!) 93/58 118/76 (!) 120/101  Pulse: (!) 58 (!) 56 (!) 59 (!) 59  Resp: 17 15 16 14   Temp:          TempSrc:          SpO2: 98% 99% 100% 100%  Weight:          Height:            HEENT-  Normocephalic,  Cardiovascular - Regular rate and rhythm  Respiratory -  Non-labored breathing, No wheezing. Abdomen - soft  and non-tender, BS normal Extremities- no edema or cyanosis Skin-warm and dry   Neurological Examination  Blood pressure 120/69, pulse 70, temperature 98.4 F (36.9 C), temperature source Oral, resp. rate 16, height 5\' 7"  (1.702 m), weight 77.2 kg (170 lb 3.1 oz), SpO2 98 %.  HEENT-  Normocephalic, no lesions, without obvious abnormality.  Normal external eye and conjunctiva.   Cardiovascular- S1-S2 audible, pulses palpable throughout   Lungs-no rhonchi or wheezing noted, no excessive working breathing.  Saturations within normal limits Abdomen- All 4 quadrants palpated and nontender Extremities- Warm, dry and intact Musculoskeletal-increased tone on the right upper extremity lower extremity Skin-warm and dry, no hyperpigmentation,  vitiligo, or suspicious lesions  Neurological Examination Mental Status: Alert, oriented, thought content appropriate.  Speech fluent without evidence of aphasia however does take her a little bit longer to get her thoughts out..  Able to follow 3 step commands without difficulty. Cranial Nerves: II:  Visual fields grossly normal,  III,IV, VI: ptosis not present, extra-ocular motions intact bilaterally, pupils equal, round, reactive to light and accommodation V,VII: smile symmetric, facial light touch sensation normal bilaterally VIII: hearing normal bilaterally IX,X: uvula rises symmetrically XI: bilateral shoulder shrug XII: midline tongue extension Motor: Right : Upper extremity   4/5    Left:     Upper extremity   5/5  Lower extremity   4/5     Lower extremity   5/5 Does have increased tone in her right upper extremity and lower extremity-greater in the lower extremity than the upper extremity Sensory: Pinprick and light touch intact throughout, bilaterally Deep Tendon Reflexes: 2+ and symmetric throughout on the left upper and lower extremity with 3+ deep tendon reflexes on the right upper and lower extremity.  Her Achilles reflex is diminished on the  right secondary to significant increased tone. Plantars: Right: downgoing   Left: downgoing Cerebellar: normal finger-to-nose on the left Gait: Not tested   Laboratory Results   Recent Labs  Lab 07/25/17 1558 07/25/17 2109  NA 136  --   K 3.3*  --   CL 106  --   CO2 21*  --   GLUCOSE 131*  --   BUN 18  --   CREATININE 0.82  --   CALCIUM 8.8*  --   MG  --  2.3    Recent Labs  Lab 07/26/17 0606  CHOL 158  TRIG 84  HDL 57  CHOLHDL 2.8  VLDL 17  LDLCALC 84     Recent Labs  Lab 07/25/17 1528  GLUCAP 130*     Imaging Results   Imaging: Ct Angio Head W Or Wo Contrast  Result Date: 07/26/2017 CLINICAL DATA:  Right-sided weakness. EXAM: CT ANGIOGRAPHY HEAD AND NECK TECHNIQUE: Multidetector CT imaging of the head and neck was performed using the standard protocol during bolus administration of intravenous contrast. Multiplanar CT image reconstructions and MIPs were obtained to evaluate the vascular anatomy. Carotid stenosis measurements (when applicable) are obtained utilizing NASCET criteria, using the distal internal carotid diameter as the denominator. CONTRAST:  50mL ISOVUE-370 IOPAMIDOL (ISOVUE-370) INJECTION 76% COMPARISON:  Head CT 07/25/2017 FINDINGS: CTA NECK FINDINGS Aortic arch: There is mild calcific atherosclerosis of the aortic arch. There is no aneurysm, dissection or hemodynamically significant stenosis of the visualized ascending aorta and aortic arch. Conventional 3 vessel aortic branching pattern. The visualized proximal subclavian arteries are widely patent. Right carotid system: --Common carotid artery: Widely patent origin without common carotid artery dissection or aneurysm. --Internal carotid artery: No dissection, occlusion or aneurysm. No hemodynamically significant stenosis. --External carotid artery: No acute abnormality. Left carotid system: --Common carotid artery: Widely patent origin without common carotid artery dissection or aneurysm. --Internal  carotid artery:No dissection, occlusion or aneurysm. No hemodynamically significant stenosis. --External carotid artery: No acute abnormality. Vertebral arteries: Right dominant configuration. Both origins are normal. No dissection, occlusion or flow-limiting stenosis to the vertebrobasilar confluence. Skeleton: There is no bony spinal canal stenosis. No lytic or blastic lesion. Other neck: 11 mm left thyroid nodule. Upper chest: Biapical bullae. CTA HEAD FINDINGS Anterior circulation: --Intracranial internal carotid arteries: Atherosclerotic calcification of the internal carotid arteries at the skull base without hemodynamically significant  stenosis. --Anterior cerebral arteries: Normal. Absent right A1 segment, normal variant --Middle cerebral arteries: Normal. --Posterior communicating arteries: Absent bilaterally. Posterior circulation: --Basilar artery: Normal. --Posterior cerebral arteries: Normal. --Superior cerebellar arteries: Normal. --Inferior cerebellar arteries: Normal anterior and posterior inferior cerebellar arteries. Venous sinuses: As permitted by contrast timing, patent. 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 Delayed phase: No parenchymal contrast enhancement. Review of the MIP images confirms the above findings. IMPRESSION: No emergent large vessel occlusion or hemodynamically significant stenosis of the arteries of the head and neck. Electronically Signed   By: Deatra Robinson M.D.   On: 07/26/2017 01:13   Dg Chest 2 View  Result Date: 07/25/2017 CLINICAL DATA:  Weakness EXAM: CHEST - 2 VIEW COMPARISON:  05/27/2014 FINDINGS: Cardiac shadow is within normal limits. The lungs are well aerated bilaterally. No focal infiltrate or sizable effusion is seen. No acute bony abnormality is noted. IMPRESSION: No active cardiopulmonary disease. Electronically Signed   By: Alcide Clever M.D.   On: 07/25/2017 17:57   Ct Head Wo Contrast  Result Date:  07/25/2017 CLINICAL DATA:  Right-sided headache and right-sided weakness since yesterday. EXAM: CT HEAD WITHOUT CONTRAST TECHNIQUE: Contiguous axial images were obtained from the base of the skull through the vertex without intravenous contrast. COMPARISON:  Brain MR and head CT dated 06/27/2016. FINDINGS: Brain: Diffusely enlarged ventricles and subarachnoid spaces. Stable old left middle cerebral artery distribution the deep white matter and basal ganglia infarct with ex vacuo enlargement of the left lateral ventricle and midline shift to the left. No intracranial hemorrhage, mass lesion or CT evidence of acute infarction. Vascular: No hyperdense vessel or unexpected calcification. Skull: Normal. Negative for fracture or focal lesion. Sinuses/Orbits: Unremarkable. Other: None. IMPRESSION: 1. No acute abnormality. 2. Stable changes due to an old left middle cerebral artery distribution infarct. 3. Mild diffuse cerebral and cerebellar atrophy. Electronically Signed   By: Beckie Salts M.D.   On: 07/25/2017 16:34   Ct Angio Neck W Or Wo Contrast  Result Date: 07/26/2017 CLINICAL DATA:  Right-sided weakness. EXAM: CT ANGIOGRAPHY HEAD AND NECK TECHNIQUE: Multidetector CT imaging of the head and neck was performed using the standard protocol during bolus administration of intravenous contrast. Multiplanar CT image reconstructions and MIPs were obtained to evaluate the vascular anatomy. Carotid stenosis measurements (when applicable) are obtained utilizing NASCET criteria, using the distal internal carotid diameter as the denominator. CONTRAST:  50mL ISOVUE-370 IOPAMIDOL (ISOVUE-370) INJECTION 76% COMPARISON:  Head CT 07/25/2017 FINDINGS: CTA NECK FINDINGS Aortic arch: There is mild calcific atherosclerosis of the aortic arch. There is no aneurysm, dissection or hemodynamically significant stenosis of the visualized ascending aorta and aortic arch. Conventional 3 vessel aortic branching pattern. The visualized  proximal subclavian arteries are widely patent. Right carotid system: --Common carotid artery: Widely patent origin without common carotid artery dissection or aneurysm. --Internal carotid artery: No dissection, occlusion or aneurysm. No hemodynamically significant stenosis. --External carotid artery: No acute abnormality. Left carotid system: --Common carotid artery: Widely patent origin without common carotid artery dissection or aneurysm. --Internal carotid artery:No dissection, occlusion or aneurysm. No hemodynamically significant stenosis. --External carotid artery: No acute abnormality. Vertebral arteries: Right dominant configuration. Both origins are normal. No dissection, occlusion or flow-limiting stenosis to the vertebrobasilar confluence. Skeleton: There is no bony spinal canal stenosis. No lytic or blastic lesion. Other neck: 11 mm left thyroid nodule. Upper chest: Biapical bullae. CTA HEAD FINDINGS Anterior circulation: --Intracranial internal carotid arteries: Atherosclerotic calcification of the internal carotid arteries at  the skull base without hemodynamically significant stenosis. --Anterior cerebral arteries: Normal. Absent right A1 segment, normal variant --Middle cerebral arteries: Normal. --Posterior communicating arteries: Absent bilaterally. Posterior circulation: --Basilar artery: Normal. --Posterior cerebral arteries: Normal. --Superior cerebellar arteries: Normal. --Inferior cerebellar arteries: Normal anterior and posterior inferior cerebellar arteries. Venous sinuses: As permitted by contrast timing, patent. 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 Delayed phase: No parenchymal contrast enhancement. Review of the MIP images confirms the above findings. IMPRESSION: No emergent large vessel occlusion or hemodynamically significant stenosis of the arteries of the head and neck. Electronically Signed   By: Deatra Robinson M.D.   On: 07/26/2017 01:13    Mr Brain Wo Contrast  Result Date: 07/26/2017 CLINICAL DATA:  Stroke follow-up.  Right-sided weakness. EXAM: MRI HEAD WITHOUT CONTRAST TECHNIQUE: Multiplanar, multiecho pulse sequences of the brain and surrounding structures were obtained without intravenous contrast. COMPARISON:  CTA head neck 07/26/2017 FINDINGS: Brain: The midline structures are normal. There is no acute infarct or acute hemorrhage. No mass lesion, hydrocephalus, dural abnormality or extra-axial collection. Old left insula and corona radiata infarct. No age-advanced or lobar predominant atrophy. No chronic microhemorrhage or superficial siderosis. Vascular: Major intracranial arterial and venous sinus flow voids are preserved. Skull and upper cervical spine: The visualized skull base, calvarium, upper cervical spine and extracranial soft tissues are normal. Sinuses/Orbits: Small amount of left mastoid fluid.  Normal orbits. IMPRESSION: Old right MCA infarct involving the left insula and corona radiata. No acute abnormality. Electronically Signed   By: Deatra Robinson M.D.   On: 07/26/2017 01:55   US Abdomen Limited Ruq  Result Date: 07/26/2017 CLINICAL DATA:  Abdominal pain EXAM: ULTRASOUND ABDOMEN LIMITED RIGHT UPPER QUADRANT COMPARISON:  CT abdomen 05/01/2017 FINDINGS: Gallbladder: No gallstones or wall thickening visualized. No sonographic Murphy sign noted by sonographer. Common bile duct: Diameter: 2.6 mm Liver: 1 cm anechoic left hepatic mass most consistent with a cyst small cyst unchanged compared with prior CT abdomen dated 05/01/2017. Within normal limits in parenchymal echogenicity. Portal vein is patent on color Doppler imaging with normal direction of blood flow towards the liver. IMPRESSION: 1. No cholelithiasis or sonographic evidence of acute cholecystitis. 2. No focal hepatic abnormality. Electronically Signed   By: Elige Ko   On: 07/26/2017 09:39     IMPRESSION AND PLAN  This is a 62 year old female with of left  spheric infarct in 2011 with residual right hemiparesis and increased tone.  Patient is on baclofen and pain medications.  He also has been going to physical therapy on a regular basis since 2018 at [facility].  Her primary neurologist is at [facility].  Reason for hospitalization today is sensation of increased weakness and spasticity of right arm and leg.  MRI obtained showing no acute infarct.  Patient also had a CTA of head and neck which did not show any signifcant stenosis.     Recommendations:  --At this time would recommend patient continue physical therapy --May need extra doses of baclofen if patient feels a spasticity has increased significantly --Will need to follow-up with her primary neurologist  NEUROHOSPITALIST ADDENDUM Seen and examined the patient today. Formulated plan as documented above by PAC. I agree with recommendations as above. Will follow.  Georgiana Spinner Aroor MD [facility] [phone]  If 7pm to 7am, please call on call as listed on AMION.

## 2017-07-27 LAB — HEPATITIS PANEL, ACUTE
HEP A IGM: NEGATIVE
HEP B C IGM: NEGATIVE
Hepatitis B Surface Ag: NEGATIVE

## 2017-07-27 LAB — URINE CULTURE: Culture: NO GROWTH

## 2017-07-30 ENCOUNTER — Ambulatory Visit: Payer: Medicare HMO | Admitting: Physical Therapy

## 2017-07-30 ENCOUNTER — Encounter: Payer: Self-pay | Admitting: Physical Therapy

## 2017-07-30 VITALS — BP 138/87 | HR 68

## 2017-07-30 DIAGNOSIS — R208 Other disturbances of skin sensation: Secondary | ICD-10-CM

## 2017-07-30 DIAGNOSIS — M21371 Foot drop, right foot: Secondary | ICD-10-CM

## 2017-07-30 DIAGNOSIS — R2689 Other abnormalities of gait and mobility: Secondary | ICD-10-CM

## 2017-07-30 DIAGNOSIS — I69351 Hemiplegia and hemiparesis following cerebral infarction affecting right dominant side: Secondary | ICD-10-CM | POA: Diagnosis not present

## 2017-07-30 DIAGNOSIS — R2681 Unsteadiness on feet: Secondary | ICD-10-CM

## 2017-07-30 NOTE — Therapy (Signed)
[facility] [address] [place], [place], [address] Phone: [phone]   Fax:  [phone]  Physical Therapy Treatment  Patient Details  Name: Kristin Mahoney MRN: [id number] Date of Birth: 09/08/1955 Referring Provider: Vamsee Paruchuri, MD   Encounter Date: 07/30/2017  PT End of Session - 07/30/17 1350    Visit Number  4    Number of Visits  17    Date for PT Re-Evaluation  08/31/17    Authorization Type  [employer] H[employer]    PT Start Time  0850    PT Stop Time  0930    PT Time Calculation (min)  40 min    Activity Tolerance  Patient tolerated treatment well    Behavior During Therapy  WFL for tasks assessed/performed       Past Medical History:  Diagnosis Date  . HLD (hyperlipidemia)   . Hypertension   . Hypothyroidism   . Stroke (HCC)   . Thyroid disease     Past Surgical History:  Procedure Laterality Date  . APPENDECTOMY      Vitals:   07/30/17 0900  BP: 138/87  Pulse: 68    Subjective Assessment - 07/30/17 0857    Subjective  Pt reports last week waking up with significant R sided numbness, increased weakness, heaviness and difficulty ambulating.  Went to ED and had imaging but no new infarct seen on imaging.  Pt cleared to return to PT with new order for OT eval and treat.  Pt to also schedule appointment with neurology.  Pt reports continued numbness and heaviness on R side.  Pain in R shoulder is worse after episode.    Patient is accompained by:  Family member    Pertinent History  HTN, CVA, thyroid disease    Limitations  Walking    Patient Stated Goals  Use the Bioness for her RLE and stop using the cane.    Currently in Pain?  Yes    Pain Score  5     Pain Location  Shoulder    Pain Orientation  Right    Pain Descriptors / Indicators  Aching    Pain Type  Acute pain    Pain Onset  In the past 7 days         [facility] PT Assessment - 07/30/17 0902      Standardized Balance  Assessment   Standardized Balance Assessment  Berg Balance Test;Dynamic Gait Index;10 meter walk test    10 Meter Walk  17.69 seconds with cane/no Bioness or 1.89ft/sec      Berg Balance Test   Sit to Stand  Able to stand  independently using hands    Standing Unsupported  Able to stand safely 2 minutes    Sitting with Back Unsupported but Feet Supported on Floor or Stool  Able to sit safely and securely 2 minutes    Stand to Sit  Controls descent by using hands    Transfers  Able to transfer with verbal cueing and /or supervision    Standing Unsupported with Eyes Closed  Able to stand 10 seconds with supervision    Standing Ubsupported with Feet Together  Needs help to attain position but able to stand for 30 seconds with feet together    From Standing, Reach Forward with Outstretched Arm  Can reach forward >12 cm safely (5")    From Standing Position, Pick up Object from Floor  Able to pick up shoe safely and easily      From Standing Position, Turn to Look Behind Over each Shoulder  Looks behind from both sides and weight shifts well    Turn 360 Degrees  Needs close supervision or verbal cueing    Standing Unsupported, Alternately Place Feet on Step/Stool  Able to complete >2 steps/needs minimal assist    Standing Unsupported, One Foot in [place] to take small step independently and hold 30 seconds    Standing on One Leg  Tries to lift leg/unable to hold 3 seconds but remains standing independently    Total Score  36    Berg comment:  36/56      Dynamic Gait Index   Level Surface  Moderate Impairment    Change in Gait Speed  Mild Impairment    Gait with Horizontal Head Turns  Mild Impairment    Gait with Vertical Head Turns  Mild Impairment    Gait and Pivot Turn  Mild Impairment    Step Over Obstacle  Mild Impairment    Step Around Obstacles  Mild Impairment    Steps  Mild Impairment    Total Score  15    DGI comment:  15/24 (pt feels better in am)            No data  recorded               PT Education - 07/30/17 1350    Education provided  Yes    Education Details  current functional level, continue POC    Person(s) Educated  Patient    Methods  Explanation    Comprehension  Verbalized understanding       PT Short Term Goals - 07/30/17 0925      PT SHORT TERM GOAL #1   Title  Pt will participate in further assessment of balance and gait impairments with BERG and DGI    Baseline  Not assessed to date    Time  4    Period  Weeks    Status  Achieved      PT SHORT TERM GOAL #2   Title  Pt will demonstrate increased gait speed to >/= 2.3 ft/sec with cane and Bioness    Baseline  1.8 ft/sec decreased after hospital stay    Time  4    Period  Weeks    Status  Not Met      PT SHORT TERM GOAL #3   Title  Pt will ambulate 350' over indoor surfaces and will negotiate 4 stairs with one rail, alternating sequence MOD I with cane and Bioness with improved R foot clearance, heel strike at initial contact, and decreased recurvatum during stance    Baseline  R genu recurvatum, decreased R hip and knee flexion during swing, foot flat at initial contact    Time  4    Period  Weeks    Status  On-going      PT SHORT TERM GOAL #4   Title  Will initiate and submit paperwork for home Bioness unit    Time  4    Period  Weeks    Status  Achieved        PT Long Term Goals - 07/30/17 1345      PT LONG TERM GOAL #1   Title  Pt will demonstrate independence with LE strengthening and balance HEP    Baseline  no HEP to date    Time  8    Period  Weeks    Status  On-going    Target Date  08/31/17      PT LONG TERM GOAL #2   Title  If approved for home Bioness unit; pt will demonstrate independence with donning, care, skin check and wear schedule    Baseline  dependent to date    Time  8    Period  Weeks    Status  New    Target Date  08/31/17      PT LONG TERM GOAL #3   Title  Pt will improve gait velocity to >/= 2.0 ft/sec to improve  safety as community ambulator    Baseline  decreased to 1.89 ft/sec after hospital stay    Time  8    Period  Weeks    Status  Revised    Target Date  08/31/17      PT LONG TERM GOAL #4   Title  Pt will improve DGI by 4 points (19/24) to decrease falls risk    Baseline  15/24    Time  8    Period  Weeks    Status  Revised    Target Date  08/31/17      PT LONG TERM GOAL #5   Title  Pt will improve BERG score by 8 points to decrease falls risk    Baseline  BERG 36/56    Time  8    Period  Weeks    Status  Revised    Target Date  08/31/17      PT LONG TERM GOAL #6   Title  Pt will ambulate >200' over indoor surfaces without cane; 250' over outdoor paved surfaces with cane; negotiate 4 stairs with one rail, alternating sequence, NO cane at MOD I with improved RLE mechanics    Baseline  R genu recurvatum, decreased R hip and knee flexion during swing, foot flat at initial contact    Time  8    Period  Weeks    Status  On-going    Target Date  08/31/17            Plan - 07/30/17 1347    Clinical Impression Statement  Performed re-assessment of pt function following admission to hospital due stroke-like symptoms.  Pt cleared to continue with PT and referred for OT as well.  Overall pt did not demonstrate regression in balance or gait except for slight decrease in gait velocity.  Goals adjusted as needed.  Will continue with current POC including Bioness training.     Rehab Potential  Good    PT Frequency  2x / week    PT Duration  8 weeks    PT Treatment/Interventions  ADLs/Self Care Home Management;Electrical Stimulation;DME Instruction;Gait training;Stair training;Functional mobility training;Therapeutic activities;Therapeutic exercise;Balance training;Neuromuscular re-education;Patient/family education;Passive range of motion    PT Next Visit Plan  Check STG #3; Bioness - may need steering electrode; stairs with Bioness;  RLE NMR.  Bioness with treadmill    Consulted and  Agree with Plan of Care  Patient;Family member/caregiver    Family Member Consulted  significant other       Patient will benefit from skilled therapeutic intervention in order to improve the following deficits and impairments:  Abnormal gait, Decreased balance, Decreased strength, Difficulty walking, Impaired sensation, Pain, Impaired tone  Visit Diagnosis: Hemiplegia and hemiparesis following cerebral infarction affecting right dominant side (HCC)  Foot drop, right  Other abnormalities of gait and mobility  Other disturbances of skin sensation  Unsteadiness on feet       Problem List Patient Active Problem List   Diagnosis Date Noted  . Abdominal pain   . Weakness   . CVA (cerebral vascular accident) ([place]) 07/25/2017  . Right sided weakness 07/25/2017  . Hypokalemia 07/25/2017  . Neck pain on right side 07/25/2017  . Right-sided face pain 07/25/2017  . RUQ abdominal pain 07/25/2017  . Hypertension   . HLD (hyperlipidemia)   . Hypothyroidism     Rico Junker, PT, DPT 07/30/17    1:51 PM    [facility] [address] [place], [place], [address] Phone: [phone]   Fax:  [phone]  Name: Kristin Mahoney MRN: [id number] Date of Birth: [birth year]

## 2017-08-02 ENCOUNTER — Ambulatory Visit: Payer: Medicare HMO | Attending: Internal Medicine | Admitting: Physical Therapy

## 2017-08-02 ENCOUNTER — Encounter: Payer: Self-pay | Admitting: Physical Therapy

## 2017-08-02 DIAGNOSIS — R2681 Unsteadiness on feet: Secondary | ICD-10-CM

## 2017-08-02 DIAGNOSIS — R29898 Other symptoms and signs involving the musculoskeletal system: Secondary | ICD-10-CM | POA: Diagnosis present

## 2017-08-02 DIAGNOSIS — M25511 Pain in right shoulder: Secondary | ICD-10-CM | POA: Insufficient documentation

## 2017-08-02 DIAGNOSIS — I69353 Hemiplegia and hemiparesis following cerebral infarction affecting right non-dominant side: Secondary | ICD-10-CM | POA: Insufficient documentation

## 2017-08-02 DIAGNOSIS — M25512 Pain in left shoulder: Secondary | ICD-10-CM | POA: Diagnosis present

## 2017-08-02 DIAGNOSIS — M6281 Muscle weakness (generalized): Secondary | ICD-10-CM | POA: Insufficient documentation

## 2017-08-02 DIAGNOSIS — M25611 Stiffness of right shoulder, not elsewhere classified: Secondary | ICD-10-CM | POA: Insufficient documentation

## 2017-08-02 DIAGNOSIS — R278 Other lack of coordination: Secondary | ICD-10-CM | POA: Diagnosis present

## 2017-08-02 DIAGNOSIS — I69351 Hemiplegia and hemiparesis following cerebral infarction affecting right dominant side: Secondary | ICD-10-CM | POA: Diagnosis present

## 2017-08-02 DIAGNOSIS — R2689 Other abnormalities of gait and mobility: Secondary | ICD-10-CM | POA: Diagnosis present

## 2017-08-02 DIAGNOSIS — M25612 Stiffness of left shoulder, not elsewhere classified: Secondary | ICD-10-CM | POA: Insufficient documentation

## 2017-08-02 DIAGNOSIS — G8929 Other chronic pain: Secondary | ICD-10-CM | POA: Diagnosis present

## 2017-08-02 DIAGNOSIS — M21371 Foot drop, right foot: Secondary | ICD-10-CM | POA: Insufficient documentation

## 2017-08-02 DIAGNOSIS — R208 Other disturbances of skin sensation: Secondary | ICD-10-CM | POA: Diagnosis present

## 2017-08-02 NOTE — Therapy (Signed)
[facility] [address] [place], [place], [address] Phone: [phone]   Fax:  [phone]  Physical Therapy Treatment  Patient Details  Name: Kristin Mahoney MRN: [id number] Date of Birth: [birth year] Referring Provider: Lorelei Pont, MD   Encounter Date: 08/02/2017  PT End of Session - 08/02/17 2127    Visit Number  5    Number of Visits  17    Date for PT Re-Evaluation  08/31/17    Authorization Type  [employer] H[employer]    PT Start Time  0802    PT Stop Time  0845    PT Time Calculation (min)  43 min    Activity Tolerance  Patient tolerated treatment well    Behavior During Therapy  [facility] for tasks assessed/performed       Past Medical History:  Diagnosis Date  . HLD (hyperlipidemia)   . Hypertension   . Hypothyroidism   . Stroke ([place])   . Thyroid disease     Past Surgical History:  Procedure Laterality Date  . APPENDECTOMY      There were no vitals filed for this visit.  Subjective Assessment - 08/02/17 0817    Subjective  Pt reports heaviness on R side is improving but still has shoulder pain; took pain medicine this morning to make it tolerable.      Patient is accompained by:  Family member    Pertinent History  HTN, CVA, thyroid disease    Limitations  Walking    Patient Stated Goals  Use the Bioness for her RLE and stop using the cane.    Currently in Pain?  Yes    Pain Score  2     Pain Location  Shoulder    Pain Orientation  Right    Pain Descriptors / Indicators  Aching    Pain Onset  In the past 7 days                       [facility] Adult PT Treatment/Exercise - 08/02/17 0818      Ambulation/Gait   Ambulation/Gait  Yes    Ambulation/Gait Assistance  6: Modified independent (Device/Increase time)    Ambulation Distance (Feet)  460 Feet    Assistive device  Straight cane    Gait Pattern  Step-through pattern;Right foot flat;Decreased trunk rotation    Ambulation  Surface  Level;Indoor    Stairs  Yes    Stairs Assistance  6: Modified independent (Device/Increase time)    Stair Management Technique  One rail Right;Alternating pattern;Forwards    Number of Stairs  8    Height of Stairs  6    Gait Comments  improved foot clearance and eccentric control on stairs with Bioness; during gait pt continues to present with decreased heel strike and increased foot slap and increased genu recurvatum during stance phase.      Neuro Re-ed    Neuro Re-ed Details   With Bioness in training mode and gait mode; one UE support on countertop, R foot on rockerboard stepping L foot forwards and back and then gait forwards and retro at counter top with one UE support > no UE support + gait x 115' with cane with focus on forward weight shift of pelvis over R stance to minimize recurvatum      Modalities   Modalities  Electrical Stimulation      Electrical Stimulation   Electrical Stimulation Location  R anterior tibialis and R  hamstring muscles    Electrical Stimulation Action  open and closed chain: ankle DF, knee flexion and hip extension    Electrical Stimulation Parameters  saved parameters in Tablet 1, quick fit electrodes    Electrical Stimulation Goals  Strength;Neuromuscular facilitation               PT Short Term Goals - 08/02/17 2128      PT SHORT TERM GOAL #1   Title  Pt will participate in further assessment of balance and gait impairments with BERG and DGI    Time  4    Period  Weeks    Status  Achieved      PT SHORT TERM GOAL #2   Title  Pt will demonstrate increased gait speed to >/= 2.3 ft/sec with cane and Bioness    Baseline  1.8 ft/sec decreased after hospital stay    Time  4    Period  Weeks    Status  Not Met      PT SHORT TERM GOAL #3   Title  Pt will ambulate 350' over indoor surfaces and will negotiate 4 stairs with one rail, alternating sequence MOD I with cane and Bioness with improved R foot clearance, heel strike at initial  contact, and decreased recurvatum during stance    Time  4    Period  Weeks    Status  Achieved      PT SHORT TERM GOAL #4   Title  Will initiate and submit paperwork for home Bioness unit    Time  4    Period  Weeks    Status  Achieved        PT Long Term Goals - 07/30/17 1345      PT LONG TERM GOAL #1   Title  Pt will demonstrate independence with LE strengthening and balance HEP    Baseline  no HEP to date    Time  8    Period  Weeks    Status  On-going    Target Date  08/31/17      PT LONG TERM GOAL #2   Title  If approved for home Bioness unit; pt will demonstrate independence with donning, care, skin check and wear schedule    Baseline  dependent to date    Time  8    Period  Weeks    Status  New    Target Date  08/31/17      PT LONG TERM GOAL #3   Title  Pt will improve gait velocity to >/= 2.0 ft/sec to improve safety as community ambulator    Baseline  decreased to 1.89 ft/sec after hospital stay    Time  8    Period  Weeks    Status  Revised    Target Date  08/31/17      PT LONG TERM GOAL #4   Title  Pt will improve DGI by 4 points (19/24) to decrease falls risk    Baseline  15/24    Time  8    Period  Weeks    Status  Revised    Target Date  08/31/17      PT LONG TERM GOAL #5   Title  Pt will improve BERG score by 8 points to decrease falls risk    Baseline  BERG 36/56    Time  8    Period  Weeks    Status  Revised    Target Date  08/31/17  PT LONG TERM GOAL #6   Title  Pt will ambulate >200' over indoor surfaces without cane; 250' over outdoor paved surfaces with cane; negotiate 4 stairs with one rail, alternating sequence, NO cane at MOD I with improved RLE mechanics    Baseline  R genu recurvatum, decreased R hip and knee flexion during swing, foot flat at initial contact    Time  8    Period  Weeks    Status  On-going    Target Date  08/31/17            Plan - 08/02/17 2127    Clinical Impression Statement  Completed  assessment of STG and returned to use of Bioness functional electrical stimulation for gait training (gait parameters adjusted) and for NMR.  Focus of NMR on increased heel strike and forward weight shift of COG over R stance LE with improved knee control and terminal hip extension with pt demonstrating improved gait velocity.  Will continue to progress towards LTG.    Rehab Potential  Good    PT Frequency  2x / week    PT Duration  8 weeks    PT Treatment/Interventions  ADLs/Self Care Home Management;Electrical Stimulation;DME Instruction;Gait training;Stair training;Functional mobility training;Therapeutic activities;Therapeutic exercise;Balance training;Neuromuscular re-education;Patient/family education;Passive range of motion    PT Next Visit Plan  stairs with Bioness;  RLE NMR for increased R knee control - may need to stretch R gastroc first.  Bioness with treadmill    Consulted and Agree with Plan of Care  Patient;Family member/caregiver    Family Member Consulted  significant other       Patient will benefit from skilled therapeutic intervention in order to improve the following deficits and impairments:  Abnormal gait, Decreased balance, Decreased strength, Difficulty walking, Impaired sensation, Pain, Impaired tone  Visit Diagnosis: Hemiplegia and hemiparesis following cerebral infarction affecting right dominant side (HCC)  Foot drop, right  Other abnormalities of gait and mobility  Other disturbances of skin sensation  Unsteadiness on feet     Problem List Patient Active Problem List   Diagnosis Date Noted  . Abdominal pain   . Weakness   . CVA (cerebral vascular accident) ([place]) 07/25/2017  . Right sided weakness 07/25/2017  . Hypokalemia 07/25/2017  . Neck pain on right side 07/25/2017  . Right-sided face pain 07/25/2017  . RUQ abdominal pain 07/25/2017  . Hypertension   . HLD (hyperlipidemia)   . Hypothyroidism     Rico Junker, PT, DPT 08/02/17    9:32  PM    [facility] [address] [place], [place], [address] Phone: [phone]   Fax:  [phone]  Name: Rether Rison MRN: [id number] Date of Birth: 1955/07/01

## 2017-08-06 ENCOUNTER — Encounter: Payer: Self-pay | Admitting: Physical Therapy

## 2017-08-06 ENCOUNTER — Ambulatory Visit: Payer: Medicare HMO | Admitting: Physical Therapy

## 2017-08-06 DIAGNOSIS — R2681 Unsteadiness on feet: Secondary | ICD-10-CM

## 2017-08-06 DIAGNOSIS — M21371 Foot drop, right foot: Secondary | ICD-10-CM

## 2017-08-06 DIAGNOSIS — R2689 Other abnormalities of gait and mobility: Secondary | ICD-10-CM

## 2017-08-06 DIAGNOSIS — I69351 Hemiplegia and hemiparesis following cerebral infarction affecting right dominant side: Secondary | ICD-10-CM

## 2017-08-06 DIAGNOSIS — R208 Other disturbances of skin sensation: Secondary | ICD-10-CM

## 2017-08-06 NOTE — Therapy (Signed)
[facility] [address] [place], [place], [address] Phone: [phone]   Fax:  [phone]  Physical Therapy Treatment  Patient Details  Name: Kristin Mahoney MRN: [id number] Date of Birth: [birth year] Referring Provider: Lorelei Pont, MD   Encounter Date: 08/06/2017  PT End of Session - 08/06/17 2052    Visit Number  6    Number of Visits  17    Date for PT Re-Evaluation  08/31/17    Authorization Type  [employer] H[employer]    PT Start Time  0802    PT Stop Time  0848    PT Time Calculation (min)  46 min    Equipment Utilized During Treatment  Other (comment) bioness    Activity Tolerance  Patient tolerated treatment well    Behavior During Therapy  [facility] for tasks assessed/performed       Past Medical History:  Diagnosis Date  . HLD (hyperlipidemia)   . Hypertension   . Hypothyroidism   . Stroke ([place])   . Thyroid disease     Past Surgical History:  Procedure Laterality Date  . APPENDECTOMY      There were no vitals filed for this visit.  Subjective Assessment - 08/06/17 0807    Subjective  Heaviness on R side continues to improve; using the TENS unit to manage shoulder pain.  Initially when pt went to ED pain on R side was worse than baseline and pt reports increased tightness in RUE from baseline.  No change in RUE function (holding objects, dressing, etc).    Patient is accompained by:  Family member    Pertinent History  HTN, CVA, thyroid disease    Limitations  Walking    Patient Stated Goals  Use the Bioness for her RLE and stop using the cane.    Currently in Pain?  Yes    Pain Score  2     Pain Location  Shoulder    Pain Orientation  Right    Pain Descriptors / Indicators  Aching    Pain Onset  In the past 7 days                       [facility] Adult PT Treatment/Exercise - 08/06/17 2045      Exercises   Exercises  Ankle;Knee/Hip      Knee/Hip Exercises: Aerobic   Tread Mill  NMR on treadmill with use of Bioness on RLE in gait training mode: single leg RLE on treadmill with focus on timing and sequencing of swing and stance phase at 1.5 mph x 5 minutes; transitioned back to bipedal on treadmill x 5 minutes at 1.5 mph with continued use of Bioness and continued focus on full L lateral weight shift to allow full RLE step length and bilat heel strike and full teminal hip extension      Modalities   Modalities  Electrical Stimulation      Electrical Stimulation   Electrical Stimulation Location  R anterior tibialis and R hamstring muscles    Electrical Stimulation Action  open and closed chain: ankle DF, knee flexion and hip extension    Electrical Stimulation Parameters  saved parameters in Tablet 1, quick fit electrodes    Electrical Stimulation Goals  Strength;Neuromuscular facilitation      Ankle Exercises: Stretches   Gastroc Stretch  5 reps;10 seconds    Gastroc Stretch Limitations  on edge of stair with use of Bioness in training mode for  counter-contraction             PT Education - 08/06/17 2052    Education provided  Yes    Education Details  gait on treadmill    Person(s) Educated  Patient    Methods  Explanation    Comprehension  Verbalized understanding       PT Short Term Goals - 08/02/17 2128      PT SHORT TERM GOAL #1   Title  Pt will participate in further assessment of balance and gait impairments with BERG and DGI    Time  4    Period  Weeks    Status  Achieved      PT SHORT TERM GOAL #2   Title  Pt will demonstrate increased gait speed to >/= 2.3 ft/sec with cane and Bioness    Baseline  1.8 ft/sec decreased after hospital stay    Time  4    Period  Weeks    Status  Not Met      PT SHORT TERM GOAL #3   Title  Pt will ambulate 350' over indoor surfaces and will negotiate 4 stairs with one rail, alternating sequence MOD I with cane and Bioness with improved R foot clearance, heel strike at initial contact, and  decreased recurvatum during stance    Time  4    Period  Weeks    Status  Achieved      PT SHORT TERM GOAL #4   Title  Will initiate and submit paperwork for home Bioness unit    Time  4    Period  Weeks    Status  Achieved        PT Long Term Goals - 07/30/17 1345      PT LONG TERM GOAL #1   Title  Pt will demonstrate independence with LE strengthening and balance HEP    Baseline  no HEP to date    Time  8    Period  Weeks    Status  On-going    Target Date  08/31/17      PT LONG TERM GOAL #2   Title  If approved for home Bioness unit; pt will demonstrate independence with donning, care, skin check and wear schedule    Baseline  dependent to date    Time  8    Period  Weeks    Status  New    Target Date  08/31/17      PT LONG TERM GOAL #3   Title  Pt will improve gait velocity to >/= 2.0 ft/sec to improve safety as community ambulator    Baseline  decreased to 1.89 ft/sec after hospital stay    Time  8    Period  Weeks    Status  Revised    Target Date  08/31/17      PT LONG TERM GOAL #4   Title  Pt will improve DGI by 4 points (19/24) to decrease falls risk    Baseline  15/24    Time  8    Period  Weeks    Status  Revised    Target Date  08/31/17      PT LONG TERM GOAL #5   Title  Pt will improve BERG score by 8 points to decrease falls risk    Baseline  BERG 36/56    Time  8    Period  Weeks    Status  Revised    Target Date  08/31/17      PT LONG TERM GOAL #6   Title  Pt will ambulate >200' over indoor surfaces without cane; 250' over outdoor paved surfaces with cane; negotiate 4 stairs with one rail, alternating sequence, NO cane at MOD I with improved RLE mechanics    Baseline  R genu recurvatum, decreased R hip and knee flexion during swing, foot flat at initial contact    Time  8    Period  Weeks    Status  On-going    Target Date  08/31/17            Plan - 08/06/17 2053    Clinical Impression Statement  Continued to focus on NMR for  gait training beginning with use of Bioness on stairs during gastroc stretching to improve ankle DF and forward weight shifting.  Transitioned to the treadmill with use of Bioness for single leg and bipedal gait training.  Pt tolerated well and will benefit from continued gait training and proximal hip strengthening.      Rehab Potential  Good    PT Frequency  2x / week    PT Duration  8 weeks    PT Treatment/Interventions  ADLs/Self Care Home Management;Electrical Stimulation;DME Instruction;Gait training;Stair training;Functional mobility training;Therapeutic activities;Therapeutic exercise;Balance training;Neuromuscular re-education;Patient/family education;Passive range of motion    PT Next Visit Plan  stairs with Bioness; Bioness with treadmill single leg on belt and then bilat LE on belt - focus on L lateral weight shifting to allow full RLE step length/heel strike.      Consulted and Agree with Plan of Care  Patient;Family member/caregiver    Family Member Consulted  significant other       Patient will benefit from skilled therapeutic intervention in order to improve the following deficits and impairments:  Abnormal gait, Decreased balance, Decreased strength, Difficulty walking, Impaired sensation, Pain, Impaired tone  Visit Diagnosis: Hemiplegia and hemiparesis following cerebral infarction affecting right dominant side (HCC)  Foot drop, right  Other abnormalities of gait and mobility  Other disturbances of skin sensation  Unsteadiness on feet     Problem List Patient Active Problem List   Diagnosis Date Noted  . Abdominal pain   . Weakness   . CVA (cerebral vascular accident) ([place]) 07/25/2017  . Right sided weakness 07/25/2017  . Hypokalemia 07/25/2017  . Neck pain on right side 07/25/2017  . Right-sided face pain 07/25/2017  . RUQ abdominal pain 07/25/2017  . Hypertension   . HLD (hyperlipidemia)   . Hypothyroidism     Rico Junker, PT, DPT 08/06/17    9:05  PM    [facility] [address] [place], [place], [address] Phone: [phone]   Fax:  [phone]  Name: Kristin Mahoney MRN: [id number] Date of Birth: 07/12/1955

## 2017-08-09 ENCOUNTER — Encounter: Payer: Self-pay | Admitting: Physical Therapy

## 2017-08-09 ENCOUNTER — Ambulatory Visit: Payer: Medicare HMO | Admitting: Physical Therapy

## 2017-08-09 DIAGNOSIS — I69351 Hemiplegia and hemiparesis following cerebral infarction affecting right dominant side: Secondary | ICD-10-CM

## 2017-08-09 DIAGNOSIS — R2689 Other abnormalities of gait and mobility: Secondary | ICD-10-CM

## 2017-08-09 NOTE — Patient Instructions (Signed)
Gastroc / Heel Cord Stretch - On Step    Stand with heels over edge of stair. Holding rail, lower heels until stretch is felt in calf of legs. Repeat _1__ times. Do _2-3__ times per day.  HOLD 30-45 secs   Stretching: Gastroc    Stand with right foot back, leg straight, forward leg bent. Keeping heel on floor, turned slightly out, lean into wall until stretch is felt in calf. Hold _30-45___ seconds. Repeat _1___ times per set. Do _1___ sets per session. Do _2-3___ sessions per day.  http://orth.exer.us/663   Copyright  VHI. All rights reserved.

## 2017-08-09 NOTE — Therapy (Signed)
[facility] [address] [place], [place], [address] Phone: [phone]   Fax:  [phone]  Physical Therapy Treatment  Patient Details  Name: Kristin Mahoney MRN: [id number] Date of Birth: 1955-12-09 Referring Provider: Lorelei Pont, MD   Encounter Date: 08/09/2017  PT End of Session - 08/09/17 0901    Visit Number  7    Number of Visits  17    Date for PT Re-Evaluation  08/31/17    Authorization Type  [employer] H[employer]    PT Start Time  [phone]    PT Stop Time  0845    PT Time Calculation (min)  50 min       Past Medical History:  Diagnosis Date  . HLD (hyperlipidemia)   . Hypertension   . Hypothyroidism   . Stroke ([place])   . Thyroid disease     Past Surgical History:  Procedure Laterality Date  . APPENDECTOMY      There were no vitals filed for this visit.  Subjective Assessment - 08/09/17 0854    Subjective  Pt states she took pain medication this morning so she is not having any pain; reports no changes since previous PT session    Patient is accompained by:  Family member    Pertinent History  HTN, CVA, thyroid disease    Patient Stated Goals  Use the Bioness for her RLE and stop using the cane.                       [facility] Adult PT Treatment/Exercise - 08/09/17 0001      Ambulation/Gait   Ambulation/Gait  Yes    Ambulation/Gait Assistance  6: Modified independent (Device/Increase time)    Ambulation Distance (Feet)  100 Feet    Assistive device  Straight cane    Gait Pattern  Step-through pattern;Right foot flat;Decreased trunk rotation    Ambulation Surface  Level;Indoor      Exercises   Exercises  Knee/Hip;Ankle      Knee/Hip Exercises: Stretches   Active Hamstring Stretch  Right;1 rep;30 seconds Runner's stretch    Gastroc Stretch  Right;2 reps;30 seconds off step and with 2" block      Knee/Hip Exercises: Standing   Heel Raises  Both;1 set;10 reps    Forward  Step Up  Right;1 set;10 reps;Hand Hold: 2;Step Height: 6"    Other Standing Knee Exercises  Rt unilateral heel raise attempted in standing - pt unable to perform this exercise due to weakness      Knee/Hip Exercises: Seated   Stool Scoot - Round Trips  1 lap around track with cues to use RLE only for Rt hamstring strengthening    Hamstring Curl  Strengthening;Right;1 set;10 reps with blue theraband      Knee/Hip Exercises: Prone   Hamstring Curl  1 set;10 reps;2 seconds    Other Prone Exercises  Rt hip extension with knee flexed at 90 degrees with intermittent assist to maintain knee flexion      Pt performed slow tap ups with Lt foot to 6" step with cues to prevent Rt knee hyperextension- intermittent tactile cues to reduce Rt genu recurvatum  Pt performed Rt gastroc strengthening in closed chain off side of high/low mat table x 10 reps  Rt knee extension control exercise in standing with blue theraband- with Lt foot in bilateral stance, forward and backward stance 15 reps  In each position Pt amb. Forward and backward along counter  with UE support - 2 reps - on tiptoes as able;  Marching forwards and backwards along counter 2 reps with CGA With cues to minimize Rt knee hyperextension as able      PT Education - 08/09/17 0900    Education provided  Yes    Education Details  instructed in 2 stretches - heel cord and hamstring in standing; gave blue theraband for seated knee flexion    Person(s) Educated  Patient    Methods  Explanation;Demonstration;Handout    Comprehension  Verbalized understanding;Returned demonstration       PT Short Term Goals - 08/02/17 2128      PT SHORT TERM GOAL #1   Title  Pt will participate in further assessment of balance and gait impairments with BERG and DGI    Time  4    Period  Weeks    Status  Achieved      PT SHORT TERM GOAL #2   Title  Pt will demonstrate increased gait speed to >/= 2.3 ft/sec with cane and Bioness    Baseline  1.8  ft/sec decreased after hospital stay    Time  4    Period  Weeks    Status  Not Met      PT SHORT TERM GOAL #3   Title  Pt will ambulate 350' over indoor surfaces and will negotiate 4 stairs with one rail, alternating sequence MOD I with cane and Bioness with improved R foot clearance, heel strike at initial contact, and decreased recurvatum during stance    Time  4    Period  Weeks    Status  Achieved      PT SHORT TERM GOAL #4   Title  Will initiate and submit paperwork for home Bioness unit    Time  4    Period  Weeks    Status  Achieved        PT Long Term Goals - 07/30/17 1345      PT LONG TERM GOAL #1   Title  Pt will demonstrate independence with LE strengthening and balance HEP    Baseline  no HEP to date    Time  8    Period  Weeks    Status  On-going    Target Date  08/31/17      PT LONG TERM GOAL #2   Title  If approved for home Bioness unit; pt will demonstrate independence with donning, care, skin check and wear schedule    Baseline  dependent to date    Time  8    Period  Weeks    Status  New    Target Date  08/31/17      PT LONG TERM GOAL #3   Title  Pt will improve gait velocity to >/= 2.0 ft/sec to improve safety as community ambulator    Baseline  decreased to 1.89 ft/sec after hospital stay    Time  8    Period  Weeks    Status  Revised    Target Date  08/31/17      PT LONG TERM GOAL #4   Title  Pt will improve DGI by 4 points (19/24) to decrease falls risk    Baseline  15/24    Time  8    Period  Weeks    Status  Revised    Target Date  08/31/17      PT LONG TERM GOAL #5   Title  Pt will improve  BERG score by 8 points to decrease falls risk    Baseline  BERG 36/56    Time  8    Period  Weeks    Status  Revised    Target Date  08/31/17      PT LONG TERM GOAL #6   Title  Pt will ambulate >200' over indoor surfaces without cane; 250' over outdoor paved surfaces with cane; negotiate 4 stairs with one rail, alternating sequence, NO cane at  MOD I with improved RLE mechanics    Baseline  R genu recurvatum, decreased R hip and knee flexion during swing, foot flat at initial contact    Time  8    Period  Weeks    Status  On-going    Target Date  08/31/17            Plan - 08/09/17 0902    Clinical Impression Statement  Bioness not used this session due to myself as PT not trained in this modality; session focused on isolated Rt hamstrings and gastroc strenghtening for Rt knee control.  Pt has significant Rt genu recurvatum in stance as well as decreased Rt dorsiflexion and decreased push off due to weakness i dorsiflexors and plantarflexors.  Pt reported fatigue at end of session.     Rehab Potential  Good    PT Frequency  2x / week    PT Duration  8 weeks    PT Treatment/Interventions  ADLs/Self Care Home Management;Electrical Stimulation;DME Instruction;Gait training;Stair training;Functional mobility training;Therapeutic activities;Therapeutic exercise;Balance training;Neuromuscular re-education;Patient/family education;Passive range of motion    PT Next Visit Plan  stairs with Bioness; Bioness with treadmill single leg on belt and then bilat LE on belt - focus on L lateral weight shifting to allow full RLE step length/heel strike (this was Audra's plan from 08-06-17 session); pt states she wants to work on strengthening for part of next PT session on Friday, 08-13-17 as well as some Bioness training    PT [place]  added Rt hamstring and heel cord stretches to HEP    Consulted and Agree with Plan of Care  Patient;Family member/caregiver       Patient will benefit from skilled therapeutic intervention in order to improve the following deficits and impairments:  Abnormal gait, Decreased balance, Decreased strength, Difficulty walking, Impaired sensation, Pain, Impaired tone  Visit Diagnosis: Hemiplegia and hemiparesis following cerebral infarction affecting right dominant side (HCC)  Other abnormalities of gait and  mobility     Problem List Patient Active Problem List   Diagnosis Date Noted  . Abdominal pain   . Weakness   . CVA (cerebral vascular accident) ([place]) 07/25/2017  . Right sided weakness 07/25/2017  . Hypokalemia 07/25/2017  . Neck pain on right side 07/25/2017  . Right-sided face pain 07/25/2017  . RUQ abdominal pain 07/25/2017  . Hypertension   . HLD (hyperlipidemia)   . Hypothyroidism     Stanislav Gervase, [place], PT 08/09/2017, [facility] [address] [place], [place], [address] Phone: [phone]   Fax:  [phone]  Name: Kristin Mahoney MRN: [id number] Date of Birth: 1955-06-09

## 2017-08-13 ENCOUNTER — Ambulatory Visit: Payer: Medicare HMO | Admitting: Physical Therapy

## 2017-08-13 ENCOUNTER — Encounter: Payer: Self-pay | Admitting: Physical Therapy

## 2017-08-13 DIAGNOSIS — R2689 Other abnormalities of gait and mobility: Secondary | ICD-10-CM

## 2017-08-13 DIAGNOSIS — I69351 Hemiplegia and hemiparesis following cerebral infarction affecting right dominant side: Secondary | ICD-10-CM | POA: Diagnosis not present

## 2017-08-13 NOTE — Therapy (Signed)
[facility] [address] [place], [place], [address] Phone: [phone]   Fax:  [phone]  Physical Therapy Treatment  Patient Details  Name: Kristin Mahoney MRN: [id number] Date of Birth: 1955/11/13 Referring Provider: Lorelei Pont, MD   Encounter Date: 08/13/2017  PT End of Session - 08/13/17 0947    Visit Number  8    Number of Visits  17    Date for PT Re-Evaluation  08/31/17    Authorization Type  [employer] H[employer]    PT Start Time  0800    PT Stop Time  0845    PT Time Calculation (min)  45 min    Equipment Utilized During Treatment  Other (comment) bioness    Activity Tolerance  Patient tolerated treatment well    Behavior During Therapy  [facility] for tasks assessed/performed       Past Medical History:  Diagnosis Date  . HLD (hyperlipidemia)   . Hypertension   . Hypothyroidism   . Stroke ([place])   . Thyroid disease     Past Surgical History:  Procedure Laterality Date  . APPENDECTOMY      There were no vitals filed for this visit.  Subjective Assessment - 08/13/17 0800    Subjective  No changes.   (Pended)     Patient is accompained by:  Family member  (Pended)     Pertinent History  HTN, CVA, thyroid disease  (Pended)     Patient Stated Goals  Use the Bioness for her RLE and stop using the cane.  (Pended)     Currently in Pain?  No/denies  (Pended)       TREATMENT   Utilized Bioness functional e-stim on RLE extremity for 35 minutes.   E-stim parameters are:  quickfit electrodes and standard bioness settings.  Patient required minguard assist using SPC with tripod tip x 230 feet, x 115 ft, and between stations during session.  PT provided min cues for rt knee control to prevent genu recurvatum (advancing hip over foot more timely fashion).  Also performed training to recruit dorsiflexors and hamstrings (muscle group) in:  Standing for hamstring curls with 2# weight x 5 sec hold x 10 reps,  prone for hamstring curl no weight x 5 second hold x 10 reps.   Other treatment: Nu-Step x 7 minutes on level 6 (warm-up to loosen RLE and for muscular endurance); mini-squats x 10 reps with return to standing with knee control and no hyperextension.                          PT Short Term Goals - 08/02/17 2128      PT SHORT TERM GOAL #1   Title  Pt will participate in further assessment of balance and gait impairments with BERG and DGI    Time  4    Period  Weeks    Status  Achieved      PT SHORT TERM GOAL #2   Title  Pt will demonstrate increased gait speed to >/= 2.3 ft/sec with cane and Bioness    Baseline  1.8 ft/sec decreased after hospital stay    Time  4    Period  Weeks    Status  Not Met      PT SHORT TERM GOAL #3   Title  Pt will ambulate 350' over indoor surfaces and will negotiate 4 stairs with one rail, alternating sequence MOD I with cane  and Bioness with improved R foot clearance, heel strike at initial contact, and decreased recurvatum during stance    Time  4    Period  Weeks    Status  Achieved      PT SHORT TERM GOAL #4   Title  Will initiate and submit paperwork for home Bioness unit    Time  4    Period  Weeks    Status  Achieved        PT Long Term Goals - 07/30/17 1345      PT LONG TERM GOAL #1   Title  Pt will demonstrate independence with LE strengthening and balance HEP    Baseline  no HEP to date    Time  8    Period  Weeks    Status  On-going    Target Date  08/31/17      PT LONG TERM GOAL #2   Title  If approved for home Bioness unit; pt will demonstrate independence with donning, care, skin check and wear schedule    Baseline  dependent to date    Time  8    Period  Weeks    Status  New    Target Date  08/31/17      PT LONG TERM GOAL #3   Title  Pt will improve gait velocity to >/= 2.0 ft/sec to improve safety as community ambulator    Baseline  decreased to 1.89 ft/sec after hospital stay    Time  8     Period  Weeks    Status  Revised    Target Date  08/31/17      PT LONG TERM GOAL #4   Title  Pt will improve DGI by 4 points (19/24) to decrease falls risk    Baseline  15/24    Time  8    Period  Weeks    Status  Revised    Target Date  08/31/17      PT LONG TERM GOAL #5   Title  Pt will improve BERG score by 8 points to decrease falls risk    Baseline  BERG 36/56    Time  8    Period  Weeks    Status  Revised    Target Date  08/31/17      PT LONG TERM GOAL #6   Title  Pt will ambulate >200' over indoor surfaces without cane; 250' over outdoor paved surfaces with cane; negotiate 4 stairs with one rail, alternating sequence, NO cane at MOD I with improved RLE mechanics    Baseline  R genu recurvatum, decreased R hip and knee flexion during swing, foot flat at initial contact    Time  8    Period  Weeks    Status  On-going    Target Date  08/31/17            Plan - 08/13/17 0951    Clinical Impression Statement  Bioness used for both gait training and strength training of tibialis antreior and hamstrings. Initial difficulty getting lower cuff properly placed to not recruit plantarflexors, not get pure eversion, but to get dorsiflexion with eversion. ? could benefit from steering electrode if this continues to be an issue. Overall, pt highly motivated and making progress towards goals.     Rehab Potential  Good    PT Frequency  2x / week    PT Duration  8 weeks    PT Treatment/Interventions  ADLs/Self Care  Home Management;Electrical Stimulation;DME Instruction;Gait training;Stair training;Functional mobility training;Therapeutic activities;Therapeutic exercise;Balance training;Neuromuscular re-education;Patient/family education;Passive range of motion    PT Next Visit Plan  stairs with Bioness; Bioness with treadmill single leg on belt and then bilat LE on belt - focus on L lateral weight shifting to allow full RLE step length/heel strike (?need steering electrode)    PT Home  Exercise Plan  added Rt hamstring and heel cord stretches to HEP    Consulted and Agree with Plan of Care  Patient;Family member/caregiver       Patient will benefit from skilled therapeutic intervention in order to improve the following deficits and impairments:  Abnormal gait, Decreased balance, Decreased strength, Difficulty walking, Impaired sensation, Pain, Impaired tone  Visit Diagnosis: Other abnormalities of gait and mobility     Problem List Patient Active Problem List   Diagnosis Date Noted  . Abdominal pain   . Weakness   . CVA (cerebral vascular accident) ([place]) 07/25/2017  . Right sided weakness 07/25/2017  . Hypokalemia 07/25/2017  . Neck pain on right side 07/25/2017  . Right-sided face pain 07/25/2017  . RUQ abdominal pain 07/25/2017  . Hypertension   . HLD (hyperlipidemia)   . Hypothyroidism     Rexanne Mano, PT 08/13/2017, 9:58 AM  [facility] [address] [place], [place], [address] Phone: [phone]   Fax:  [phone]  Name: Kristin Mahoney MRN: [id number] Date of Birth: 08-26-1955

## 2017-08-16 ENCOUNTER — Ambulatory Visit: Payer: Medicare HMO | Admitting: Occupational Therapy

## 2017-08-16 ENCOUNTER — Encounter: Payer: Self-pay | Admitting: Rehabilitation

## 2017-08-16 ENCOUNTER — Ambulatory Visit: Payer: Medicare HMO | Admitting: Rehabilitation

## 2017-08-16 ENCOUNTER — Encounter: Payer: Self-pay | Admitting: Occupational Therapy

## 2017-08-16 DIAGNOSIS — M25512 Pain in left shoulder: Secondary | ICD-10-CM

## 2017-08-16 DIAGNOSIS — G8929 Other chronic pain: Secondary | ICD-10-CM

## 2017-08-16 DIAGNOSIS — R29898 Other symptoms and signs involving the musculoskeletal system: Secondary | ICD-10-CM

## 2017-08-16 DIAGNOSIS — M21371 Foot drop, right foot: Secondary | ICD-10-CM

## 2017-08-16 DIAGNOSIS — I69353 Hemiplegia and hemiparesis following cerebral infarction affecting right non-dominant side: Secondary | ICD-10-CM

## 2017-08-16 DIAGNOSIS — I69351 Hemiplegia and hemiparesis following cerebral infarction affecting right dominant side: Secondary | ICD-10-CM

## 2017-08-16 DIAGNOSIS — R278 Other lack of coordination: Secondary | ICD-10-CM

## 2017-08-16 DIAGNOSIS — M6281 Muscle weakness (generalized): Secondary | ICD-10-CM

## 2017-08-16 DIAGNOSIS — R2681 Unsteadiness on feet: Secondary | ICD-10-CM

## 2017-08-16 DIAGNOSIS — R2689 Other abnormalities of gait and mobility: Secondary | ICD-10-CM

## 2017-08-16 DIAGNOSIS — M25612 Stiffness of left shoulder, not elsewhere classified: Secondary | ICD-10-CM

## 2017-08-16 NOTE — Therapy (Signed)
[facility] [address] [place], [place], [address] Phone: [phone]   Fax:  [phone]  Physical Therapy Treatment  Patient Details  Name: Kristin Mahoney MRN: [id number] Date of Birth: 1955/11/07 Referring Provider: Latricia Heft Paruchuri,MD   Encounter Date: 08/16/2017  PT End of Session - 08/16/17 0934    Visit Number  9    Number of Visits  17    Date for PT Re-Evaluation  08/31/17    Authorization Type  [employer] H[employer]    PT Start Time  [phone]    PT Stop Time  0930    PT Time Calculation (min)  44 min    Equipment Utilized During Treatment  Other (comment) bioness    Activity Tolerance  Patient tolerated treatment well    Behavior During Therapy  [facility] for tasks assessed/performed       Past Medical History:  Diagnosis Date  . HLD (hyperlipidemia)   . Hypertension   . Hypothyroidism   . Stroke ([place])   . Thyroid disease     Past Surgical History:  Procedure Laterality Date  . APPENDECTOMY      There were no vitals filed for this visit.  Subjective Assessment - 08/16/17 0846    Subjective  Still having stiffness and pain in R leg.  Using TENS unit.    Patient is accompained by:  Family member    Pertinent History  HTN, CVA, thyroid disease    Limitations  Walking    Patient Stated Goals  Use the Bioness for her RLE and stop using the cane.    Currently in Pain?  Yes    Pain Score  5     Pain Location  Hip    Pain Orientation  Right    Pain Descriptors / Indicators  Tightness;Aching    Pain Type  Acute pain    Pain Onset  1 to 4 weeks ago    Pain Frequency  Constant    Aggravating Factors   Not taking pain medication, over exercising     Pain Relieving Factors  TENS, medication, movement                       [facility] Adult PT Treatment/Exercise - 08/16/17 0906      Ambulation/Gait   Ambulation/Gait  Yes    Ambulation/Gait Assistance  4: Min guard;4: Min assist    Ambulation/Gait Assistance Details  Utilized Bioness R lower leg for DF assist and R hamstring for improved knee flex during swing phase of gait.   Performed 115' with cane at min/guard A level with cues for upright posture and decreased R LE circumduction.  Note that PT switched to steering electrode and had marked improvement in DF with less eversion.  Also utilized treadmill (2 mins with single LE, 2 mins with B LE at 1.0 mph) during session with LLE off to the side to improve L lateral weight shift for larger R step length.  Continue to note circumduction and wide BOS but pt able to correct with cuing and also adjusted settings on Bioness to activate hamstrings sooner.      Ambulation Distance (Feet)  200 Feet and another 115' x 1   Assistive device  Straight cane with quad tip attachment    Gait Pattern  Step-through pattern;Right foot flat;Decreased trunk rotation    Ambulation Surface  Level;Indoor    Stairs  Yes    Stairs Assistance  5: Supervision;6: Modified  independent (Device/Increase time)    Stairs Assistance Details (indicate cue type and reason)  Min cues for increased R hip flex when ascending stairs.     Stair Management Technique  One rail Right;Alternating pattern;Forwards    Number of Stairs  4 x 4 reps    Height of Stairs  6      Knee/Hip Exercises: Aerobic   Elliptical  Performed as warm up prior to gait and NMR with Bioness.  3 mins forward with BUE support to improve fluidity of movement and decrease tone.     [place]  see gait section             PT Education - 08/16/17 [phone]    Education provided  Yes    Education Details  purpose of switching to steering electrodes    Person(s) Educated  Patient    Methods  Explanation    Comprehension  Verbalized understanding       PT Short Term Goals - 08/02/17 2128      PT SHORT TERM GOAL #1   Title  Pt will participate in further assessment of balance and gait impairments with BERG and DGI    Time  4    Period   Weeks    Status  Achieved      PT SHORT TERM GOAL #2   Title  Pt will demonstrate increased gait speed to >/= 2.3 ft/sec with cane and Bioness    Baseline  1.8 ft/sec decreased after hospital stay    Time  4    Period  Weeks    Status  Not Met      PT SHORT TERM GOAL #3   Title  Pt will ambulate 350' over indoor surfaces and will negotiate 4 stairs with one rail, alternating sequence MOD I with cane and Bioness with improved R foot clearance, heel strike at initial contact, and decreased recurvatum during stance    Time  4    Period  Weeks    Status  Achieved      PT SHORT TERM GOAL #4   Title  Will initiate and submit paperwork for home Bioness unit    Time  4    Period  Weeks    Status  Achieved        PT Long Term Goals - 07/30/17 1345      PT LONG TERM GOAL #1   Title  Pt will demonstrate independence with LE strengthening and balance HEP    Baseline  no HEP to date    Time  8    Period  Weeks    Status  On-going    Target Date  08/31/17      PT LONG TERM GOAL #2   Title  If approved for home Bioness unit; pt will demonstrate independence with donning, care, skin check and wear schedule    Baseline  dependent to date    Time  8    Period  Weeks    Status  New    Target Date  08/31/17      PT LONG TERM GOAL #3   Title  Pt will improve gait velocity to >/= 2.0 ft/sec to improve safety as community ambulator    Baseline  decreased to 1.89 ft/sec after hospital stay    Time  8    Period  Weeks    Status  Revised    Target Date  08/31/17      PT LONG  TERM GOAL #4   Title  Pt will improve DGI by 4 points (19/24) to decrease falls risk    Baseline  15/24    Time  8    Period  Weeks    Status  Revised    Target Date  08/31/17      PT LONG TERM GOAL #5   Title  Pt will improve BERG score by 8 points to decrease falls risk    Baseline  BERG 36/56    Time  8    Period  Weeks    Status  Revised    Target Date  08/31/17      PT LONG TERM GOAL #6   Title  Pt  will ambulate >200' over indoor surfaces without cane; 250' over outdoor paved surfaces with cane; negotiate 4 stairs with one rail, alternating sequence, NO cane at MOD I with improved RLE mechanics    Baseline  R genu recurvatum, decreased R hip and knee flexion during swing, foot flat at initial contact    Time  8    Period  Weeks    Status  On-going    Target Date  08/31/17            Plan - 08/16/17 1053    Clinical Impression Statement  Continue to utilize Bioness during gait, stairs and treadmill training for improved activation of R anterior tib and hamstrings.  Note that PT switched to steering electrode with improved pure DF and decreased eversion, therefore will update primary PT.  Pt tolerated all very well but has continued stiffness due to tone.     Rehab Potential  Good    PT Frequency  2x / week    PT Duration  8 weeks    PT Treatment/Interventions  ADLs/Self Care Home Management;Electrical Stimulation;DME Instruction;Gait training;Stair training;Functional mobility training;Therapeutic activities;Therapeutic exercise;Balance training;Neuromuscular re-education;Patient/family education;Passive range of motion    PT Next Visit Plan  R LE NMR for ankle DF and hamstrings, stairs with Bioness; Bioness with treadmill single leg on belt and then bilat LE on belt - focus on L lateral weight shifting to allow full RLE step length/heel strike (?need steering electrode)    PT Home Exercise Plan  added Rt hamstring and heel cord stretches to HEP    Consulted and Agree with Plan of Care  Patient;Family member/caregiver       Patient will benefit from skilled therapeutic intervention in order to improve the following deficits and impairments:  Abnormal gait, Decreased balance, Decreased strength, Difficulty walking, Impaired sensation, Pain, Impaired tone  Visit Diagnosis: Other abnormalities of gait and mobility  Hemiplegia and hemiparesis following cerebral infarction affecting  right dominant side (HCC)  Foot drop, right  Unsteadiness on feet     Problem List Patient Active Problem List   Diagnosis Date Noted  . Abdominal pain   . Weakness   . CVA (cerebral vascular accident) ([place]) 07/25/2017  . Right sided weakness 07/25/2017  . Hypokalemia 07/25/2017  . Neck pain on right side 07/25/2017  . Right-sided face pain 07/25/2017  . RUQ abdominal pain 07/25/2017  . Hypertension   . HLD (hyperlipidemia)   . Hypothyroidism    Cameron Sprang, PT, MPT [facility] [address] [place], [place], [address] Phone: [phone]   Fax:  [phone] 08/16/17, 10:56 AM  Name: Kristin Mahoney MRN: [id number] Date of Birth: 10-17-1955

## 2017-08-16 NOTE — Therapy (Signed)
[facility] [address] [place], [place], [address] Phone: [phone]   Fax:  [phone]  Occupational Therapy Evaluation  Patient Details  Name: Kristin KaiserCatherine Mahoney MRN: [id number] Date of Birth: 03/13/1956 Referring Provider: Cathlean CowerVamsee Paruchuri,MD   Encounter Date: 08/16/2017  OT End of Session - 08/16/17 1619    Visit Number  1    Number of Visits  24    Date for OT Re-Evaluation  11/08/17    Authorization Type  Medicare    Authorization Time Period  90 days    OT Start Time  0805    OT Stop Time  0845    OT Time Calculation (min)  40 min    Activity Tolerance  Patient tolerated treatment well       Past Medical History:  Diagnosis Date  . HLD (hyperlipidemia)   . Hypertension   . Hypothyroidism   . Stroke ([facility])   . Thyroid disease     Past Surgical History:  Procedure Laterality Date  . APPENDECTOMY      There were no vitals filed for this visit.  Subjective Assessment - 08/16/17 0807    Subjective   I get pain in my R shoulder, hip and knee but I took a pain pill this morning so I don't have any pain right now.     Patient is accompained by:  Family member boyfriend    Pertinent History  Original stroke in 2011 but pt had recent admission on 07/25/2017-07/26/2017 with increased weakness. Imaging was negative.      Patient Stated Goals  I want the pain to go away so I can move the right arm better - to reach into cabinets in my kitchen without pain.    Currently in Pain?  No/denies I took a pain pill so no pain right now. During assessment however pt did have shoulder pain see eval        [facility] OT Assessment - 08/16/17 0001      Assessment   Medical Diagnosis  Recent increase in L weakness, orignal stroke in 2011    Referring Provider  Vamsee Paruchuri,MD    Onset Date/Surgical Date  07/15/17 date admitted for recent increased weakness, imaging neg    Hand Dominance  Left    Prior Therapy  PT,  OT and ST in 2011.  Orthopedic therapy for R shoulder pain about 2 years ago      Precautions   Precautions  Fall      Restrictions   Weight Bearing Restrictions  No      Balance Screen   Has the patient fallen in the past 6 months  No Pt currently working with PT      Home  Environment   Family/patient expects to be discharged to:  Private residence    Living Arrangements  Spouse/significant other boyfriend    Available Help at Discharge  Available 24 hours/day    Type of Home  House    Home Layout  One level    Bathroom Occupational psychologisthower/Tub  Tub/Shower unit    Bathroom Toilet  Standard    Additional Comments  Pt has shower seat with back, grab bars in shower, no equipment around toilet      Prior Function   Level of Independence  Independent prior to stroke    Leisure  exercises at [employer]      ADL   Eating/Feeding  Minimal assistance occassional assist to cut up food on  plate    Grooming  Modified independent    Upper Body Bathing  Modified independent    Lower Body Bathing  Modified independent    Upper Body Dressing  Independent    Lower Body Dressing  Modified independent    Toilet Transfer  Modified independent    Toileting - Clothing Manipulation  Modified independent    Toileting -  Hygiene  Modified Independent    Tub/Shower Transfer  Modified independent    ADL comments  Boyfriend will assist with transfer only if pt gets all the way down into the bathtub      IADL   Shopping  Assistance for transportation    Light Housekeeping  Maintains house alone or with occasional assistance    Meal Prep  Plans, prepares and serves adequate meals independently    Community Mobility  Relies on family or friends for transportation    Medication Management  Is responsible for taking medication in correct dosages at correct time    Physicist, medical financial matters independently (budgets, writes checks, pays rent, bills goes to bank), collects and keeps track  of income      Mobility   Mobility Status  Needs assist    Mobility Status Comments  for community ambulation uses cane primarily in community      Written Expression   Dominant Hand  Left      Vision - History   Baseline Vision  Wears glasses all the time    Additional Comments  Denies any visual changes      Activity Tolerance   Activity Tolerance  Tolerate 30+ min activity without fatigue    Activity Tolerance Comments  Pt reports that she feels like activity tolerance occassionally limiits what she wants to do      Cognition   Overall Cognitive Status  Within Functional Limits for tasks assessed      Posture/Postural Control   Posture/Postural Control  Postural limitations    Postural Limitations  Weight shift left    Posture Comments  R scapula abducted, depressed and downwardly rotated      Sensation   Light Touch  Appears Intact UE    Hot/Cold  Appears Intact    Proprioception  Appears Intact      Coordination   Gross Motor Movements are Fluid and Coordinated  No    Fine Motor Movements are Fluid and Coordinated  No    Box and Blocks  40    Other  Pt with moderate difficulty touching each finger to thumb.  Did not assess 9 hole peg at this time due to painful shoulder.        Edema   Edema  mild edema in dorsal forearm - pt reports no pain but always there.      Tone   Assessment Location  Right Upper Extremity      ROM / Strength   AROM / PROM / Strength  AROM;PROM;Strength      AROM   Overall AROM   Deficits    Overall AROM Comments  RUE= shoulder flexion to 108*, abduction to 110* then pain 4/10 with both movements and tightness.  ER full ROM however pain of 5/10 at 3/4 range.  All other WFL's      PROM   Overall PROM   Deficits    Overall PROM Comments  RUE= shoulder flexion to 120*, abduction to 110* then pain of 4/10 with tightness      Strength  Overall Strength  Deficits;Unable to assess    Overall Strength Comments  Unable to do MMT due to  altered tone however pt unable to complete full range proximally therefore no greater than 2+/5, distally 3/5      Hand Function   Right Hand Gross Grasp  Impaired    Right Hand Grip (lbs)  40    Left Hand Gross Grasp  Functional    Left Hand Grip (lbs)  90      RUE Tone   RUE Tone  Hypertonic;Modified [place] pt also postures with RUE when ambulating      RUE Tone   Modified [place] Scale for Grading Hypertonia RUE  Slight increase in muscle tone, manifested by a catch, followed by minimal resistance throughout the remainder (less than half) of the ROM                        OT Short Term Goals - 08/16/17 1339      OT SHORT TERM GOAL #1   Title  Pt will be mod I with HEP for RUE ROM/strength, coordination, and grip strength - 09/13/2017    Status  New      OT SHORT TERM GOAL #2   Title  Pt will be able to tolerate reaching to 110* of shoulder flexion to pick up light object  with no more than 2/10 pain    Status  New      OT SHORT TERM GOAL #3   Title  Pt will demonstrate improved R grip strength by at least 5 pounds to assist with opening jars when cooking (baseline= 40)    Status  New      OT SHORT TERM GOAL #4   Title  Pt will report greater ease in buttoning.     Status  New      OT SHORT TERM GOAL #5   Title  Pt will demonstrate improved postural alignment in standing to decrease R shoulder pain with overhead reaching activities    Status  New        OT Long Term Goals - 08/16/17 1343      OT LONG TERM GOAL #1   Title  Pt will be mod I with upgraded HEP for proximal strength and reach - 11/08/2017    Status  New      OT LONG TERM GOAL #2   Title  Pt will demonstrate ability to reach into overhead cabinet in kitchen with RUE to obtain dishes or food with no more than 2/10 pain    Status  New      OT LONG TERM GOAL #3   Title  Pt will demonstrate improved grip strength by at least 7 pounds R hand to assist with home mgmt tasks (baseline = 40)     Status  New      OT LONG TERM GOAL #4   Title  Pt will demonstrate ability to get change out of pocket using R hand    Status  New      OT LONG TERM GOAL #5   Title  Pt will demonstrate improved postural aligment with functional ambulation to decrease pain in R shoulder and improve ability to carry at least a 2 pound object when walking.    Status  New            Plan - 08/16/17 1349    Clinical Impression Statement  Pt is a 62 year old  female s/p initial L MCA CVA in 2011; pt with recent episode of increased weakness on R side on 07/25/2017.  Discharged home on 07/26/2017 after imaging was negative.  Pt presents today with the following deficits that impact her full participation in life roles:  R spastic hemiplegia, pain in R shoulder, muscle weakness, decreased A/PROM of R shoulder, abnormal posture, impaired coordination, decreased balance,   Pt will benefit from skilled OT to address these issues and maximize independence and improved functional use of RUE.     Occupational Profile and client history currently impacting functional performance  L MCA CVA 2011, HTN, thyroid disease, HLD, chronic shoulder pain, recent episode of increased R weakness    Occupational performance deficits (Please refer to evaluation for details):  ADL's;IADL's;Rest and Sleep;Leisure;Social Participation    Rehab Potential  Good    Current Impairments/barriers affecting progress:  chronic nature of L shoulder pain, length of time since inital onset of stroke    OT Frequency  2x / week    OT Duration  12 weeks may not need all 12 weeks    OT Treatment/Interventions  Self-care/ADL training;Aquatic Therapy;Cryotherapy;Moist Heat;Electrical Stimulation;DME and/or AE instruction;Neuromuscular education;Therapeutic exercise;Functional Mobility Training;Manual Therapy;Passive range of motion;Splinting;Therapeutic activities;Balance training;Patient/family education    Plan  begin addressing R shoulder pain, manual  therapy for alignment, NMR for RUE/trunk, balance, bed positioning for R shoulder pain    Clinical Decision Making  Multiple treatment options, significant modification of task necessary    Consulted and Agree with Plan of Care  Patient;Other (Comment) boyfriend       Patient will benefit from skilled therapeutic intervention in order to improve the following deficits and impairments:  Abnormal gait, Decreased activity tolerance, Decreased balance, Decreased coordination, Decreased knowledge of use of DME, Decreased mobility, Decreased range of motion, Difficulty walking, Decreased strength, Impaired UE functional use, Impaired tone, Pain  Visit Diagnosis: 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 Abdominal pain   . Weakness   . CVA (cerebral vascular accident) ([facility]) 07/25/2017  . Right sided weakness 07/25/2017  . Hypokalemia 07/25/2017  . Neck pain on right side 07/25/2017  . Right-sided face pain 07/25/2017  . RUQ abdominal pain 07/25/2017  . Hypertension   . HLD (hyperlipidemia)   . Hypothyroidism     Norton Pastel, OTR/L 08/16/2017, 4:25 PM  [facility] [address] [place], [place], [address] Phone: [phone]   Fax:  [phone]  Name: Kristin Mahoney MRN: [id number] Date of Birth: [birth year]

## 2017-08-18 ENCOUNTER — Ambulatory Visit: Payer: Medicare HMO | Admitting: Physical Therapy

## 2017-08-18 ENCOUNTER — Encounter: Payer: Self-pay | Admitting: Physical Therapy

## 2017-08-18 DIAGNOSIS — I69351 Hemiplegia and hemiparesis following cerebral infarction affecting right dominant side: Secondary | ICD-10-CM | POA: Diagnosis not present

## 2017-08-18 DIAGNOSIS — R2689 Other abnormalities of gait and mobility: Secondary | ICD-10-CM

## 2017-08-18 DIAGNOSIS — R2681 Unsteadiness on feet: Secondary | ICD-10-CM

## 2017-08-18 DIAGNOSIS — M21371 Foot drop, right foot: Secondary | ICD-10-CM

## 2017-08-18 DIAGNOSIS — R208 Other disturbances of skin sensation: Secondary | ICD-10-CM

## 2017-08-18 NOTE — Therapy (Signed)
[facility] [address] [place], [place], [address] Phone: [phone]   Fax:  [phone]  Physical Therapy Treatment  Patient Details  Name: Kristin Mahoney MRN: [id number] Date of Birth: 1956/01/14 Referring Provider: Latricia Heft Paruchuri,MD   Encounter Date: 08/18/2017  PT End of Session - 08/18/17 1036    Visit Number  10    Number of Visits  17    Date for PT Re-Evaluation  08/31/17    Authorization Type  [employer] H[employer]    PT Start Time  0806    PT Stop Time  0848    PT Time Calculation (min)  42 min    Activity Tolerance  Patient tolerated treatment well    Behavior During Therapy  [facility] for tasks assessed/performed       Past Medical History:  Diagnosis Date  . HLD (hyperlipidemia)   . Hypertension   . Hypothyroidism   . Stroke ([place])   . Thyroid disease     Past Surgical History:  Procedure Laterality Date  . APPENDECTOMY      There were no vitals filed for this visit.  Subjective Assessment - 08/18/17 0809    Subjective  Still reporting R shoulder, hip and knee pain but with medication pain is tolerable.  Continues to use mm rub cream on shoulder at night.  "I'm wondering if I will be able to walk without the cane."    Patient is accompained by:  Family member    Pertinent History  HTN, CVA, thyroid disease    Limitations  Walking    Patient Stated Goals  Use the Bioness for her RLE and stop using the cane.    Currently in Pain?  Yes    Pain Score  2     Pain Location  Arm    Pain Orientation  Right    Pain Descriptors / Indicators  Tightness;Discomfort    Pain Onset  1 to 4 weeks ago                       [facility] Adult PT Treatment/Exercise - 08/18/17 0814      Ambulation/Gait   Ambulation/Gait  Yes    Ambulation/Gait Assistance  4: Min guard    Ambulation/Gait Assistance Details  in // bars stepping step to and then step through sequence over low obstacles forwards  and retro with and then without UE support focusing on forward eccentric control of momentum     Assistive device  None;Parallel bars    Gait Pattern  Step-to pattern;Step-through pattern      Knee/Hip Exercises: Aerobic   Elliptical  level 1.0 resistance x 5 minutes with bilat UE support > LUE support x 5-6 seconds with focus on keeping R hip in neutral rotation and full extension through R knee and hip      Knee/Hip Exercises: Standing   Lateral Step Up  Right;1 set;10 reps;Hand Hold: 2;Hand Hold: 1;Step Height: 4"    Forward Step Up  Right;1 set;10 reps;Hand Hold: 2;Hand Hold: 1;Step Height: 4" L foot step overs             PT Education - 08/18/17 1036    Education provided  Yes    Education Details  gait training without AD    Person(s) Educated  Patient    Methods  Tactile cues;Verbal cues    Comprehension  Need further instruction       PT Short Term Goals -  08/02/17 2128      PT SHORT TERM GOAL #1   Title  Pt will participate in further assessment of balance and gait impairments with BERG and DGI    Time  4    Period  Weeks    Status  Achieved      PT SHORT TERM GOAL #2   Title  Pt will demonstrate increased gait speed to >/= 2.3 ft/sec with cane and Bioness    Baseline  1.8 ft/sec decreased after hospital stay    Time  4    Period  Weeks    Status  Not Met      PT SHORT TERM GOAL #3   Title  Pt will ambulate 350' over indoor surfaces and will negotiate 4 stairs with one rail, alternating sequence MOD I with cane and Bioness with improved R foot clearance, heel strike at initial contact, and decreased recurvatum during stance    Time  4    Period  Weeks    Status  Achieved      PT SHORT TERM GOAL #4   Title  Will initiate and submit paperwork for home Bioness unit    Time  4    Period  Weeks    Status  Achieved        PT Long Term Goals - 07/30/17 1345      PT LONG TERM GOAL #1   Title  Pt will demonstrate independence with LE strengthening and  balance HEP    Baseline  no HEP to date    Time  8    Period  Weeks    Status  On-going    Target Date  08/31/17      PT LONG TERM GOAL #2   Title  If approved for home Bioness unit; pt will demonstrate independence with donning, care, skin check and wear schedule    Baseline  dependent to date    Time  8    Period  Weeks    Status  New    Target Date  08/31/17      PT LONG TERM GOAL #3   Title  Pt will improve gait velocity to >/= 2.0 ft/sec to improve safety as community ambulator    Baseline  decreased to 1.89 ft/sec after hospital stay    Time  8    Period  Weeks    Status  Revised    Target Date  08/31/17      PT LONG TERM GOAL #4   Title  Pt will improve DGI by 4 points (19/24) to decrease falls risk    Baseline  15/24    Time  8    Period  Weeks    Status  Revised    Target Date  08/31/17      PT LONG TERM GOAL #5   Title  Pt will improve BERG score by 8 points to decrease falls risk    Baseline  BERG 36/56    Time  8    Period  Weeks    Status  Revised    Target Date  08/31/17      PT LONG TERM GOAL #6   Title  Pt will ambulate >200' over indoor surfaces without cane; 250' over outdoor paved surfaces with cane; negotiate 4 stairs with one rail, alternating sequence, NO cane at MOD I with improved RLE mechanics    Baseline  R genu recurvatum, decreased R hip and knee flexion during swing,  foot flat at initial contact    Time  8    Period  Weeks    Status  On-going    Target Date  08/31/17            Plan - 08/18/17 1037    Clinical Impression Statement  Initiated gait training today without Bioness but with focus on decreasing UE support for patient's goal of ambulating without AD.  Peformed endurance and gait sequence training on elliptical, weight shifting and LE strengthening with step ups and gait forwards and retro stepping over lower obstacles to facilitate increased weight shift, LE clearance, stance time and stride length.  Will continue to  address at next session with use of Bioness.    Rehab Potential  Good    PT Frequency  2x / week    PT Duration  8 weeks    PT Treatment/Interventions  ADLs/Self Care Home Management;Electrical Stimulation;DME Instruction;Gait training;Stair training;Functional mobility training;Therapeutic activities;Therapeutic exercise;Balance training;Neuromuscular re-education;Patient/family education;Passive range of motion    PT Next Visit Plan  elliptical/step ups/gait without cane with Bioness, stairs with Bioness, Treadmill with Bioness.      PT Home Exercise Plan  added Rt hamstring and heel cord stretches to HEP    Consulted and Agree with Plan of Care  Patient;Family member/caregiver       Patient will benefit from skilled therapeutic intervention in order to improve the following deficits and impairments:  Abnormal gait, Decreased balance, Decreased strength, Difficulty walking, Impaired sensation, Pain, Impaired tone  Visit Diagnosis: Foot drop, right  Other disturbances of skin sensation  Hemiplegia and hemiparesis following cerebral infarction affecting right dominant side (HCC)  Other abnormalities of gait and mobility  Unsteadiness on feet     Problem List Patient Active Problem List   Diagnosis Date Noted  . Abdominal pain   . Weakness   . CVA (cerebral vascular accident) ([place]) 07/25/2017  . Right sided weakness 07/25/2017  . Hypokalemia 07/25/2017  . Neck pain on right side 07/25/2017  . Right-sided face pain 07/25/2017  . RUQ abdominal pain 07/25/2017  . Hypertension   . HLD (hyperlipidemia)   . Hypothyroidism     Rico Junker, PT, DPT 08/18/17    10:42 AM    [facility] [address] [place], [place], [address] Phone: [phone]   Fax:  [phone]  Name: Kiyo Heal MRN: [id number] Date of Birth: 02/15/1956

## 2017-08-23 ENCOUNTER — Encounter: Payer: Self-pay | Admitting: Physical Therapy

## 2017-08-23 ENCOUNTER — Ambulatory Visit: Payer: Medicare HMO | Admitting: Occupational Therapy

## 2017-08-23 ENCOUNTER — Ambulatory Visit: Payer: Medicare HMO | Admitting: Physical Therapy

## 2017-08-23 ENCOUNTER — Encounter: Payer: Self-pay | Admitting: Occupational Therapy

## 2017-08-23 DIAGNOSIS — R2681 Unsteadiness on feet: Secondary | ICD-10-CM

## 2017-08-23 DIAGNOSIS — G8929 Other chronic pain: Secondary | ICD-10-CM

## 2017-08-23 DIAGNOSIS — I69351 Hemiplegia and hemiparesis following cerebral infarction affecting right dominant side: Secondary | ICD-10-CM

## 2017-08-23 DIAGNOSIS — M25511 Pain in right shoulder: Secondary | ICD-10-CM

## 2017-08-23 DIAGNOSIS — R208 Other disturbances of skin sensation: Principal | ICD-10-CM

## 2017-08-23 DIAGNOSIS — I69353 Hemiplegia and hemiparesis following cerebral infarction affecting right non-dominant side: Secondary | ICD-10-CM

## 2017-08-23 DIAGNOSIS — M21371 Foot drop, right foot: Secondary | ICD-10-CM

## 2017-08-23 DIAGNOSIS — M6281 Muscle weakness (generalized): Secondary | ICD-10-CM

## 2017-08-23 DIAGNOSIS — M25611 Stiffness of right shoulder, not elsewhere classified: Secondary | ICD-10-CM

## 2017-08-23 DIAGNOSIS — R2689 Other abnormalities of gait and mobility: Secondary | ICD-10-CM

## 2017-08-23 NOTE — Therapy (Signed)
[facility] [address] [place], [place], [address] Phone: [phone]   Fax:  [phone]  Physical Therapy Treatment  Patient Details  Name: Kristin Mahoney MRN: [id number] Date of Birth: 20-Jun-1955 Referring Provider: Latricia Heft Paruchuri,MD   Encounter Date: 08/23/2017  PT End of Session - 08/23/17 0909    Visit Number  11    Number of Visits  17    Date for PT Re-Evaluation  08/31/17    Authorization Type  [employer] H[employer]    PT Start Time  0800    PT Stop Time  0847    PT Time Calculation (min)  47 min    Activity Tolerance  Patient tolerated treatment well    Behavior During Therapy  [facility] for tasks assessed/performed       Past Medical History:  Diagnosis Date  . HLD (hyperlipidemia)   . Hypertension   . Hypothyroidism   . Stroke ([place])   . Thyroid disease     Past Surgical History:  Procedure Laterality Date  . APPENDECTOMY      There were no vitals filed for this visit.  Subjective Assessment - 08/23/17 0807    Subjective  Had a good weekend; still having some R shoulder pain.  Was a little sore after using the elliptical with PT but improved quickly    Patient is accompained by:  Family member    Pertinent History  HTN, CVA, thyroid disease    Limitations  Walking    Patient Stated Goals  Use the Bioness for her RLE and stop using the cane.    Currently in Pain?  Yes    Pain Score  2     Pain Location  Shoulder    Pain Orientation  Right    Pain Descriptors / Indicators  Discomfort    Pain Type  Chronic pain    Pain Onset  1 to 4 weeks ago                       [facility] Adult PT Treatment/Exercise - 08/23/17 0808      Ambulation/Gait   Ambulation/Gait  Yes    Ambulation/Gait Assistance  4: Min assist    Ambulation/Gait Assistance Details  over ground with Bioness in gait mode, without use of AD with focus on full L lateral and anterior weight shifting to allow for full  step length, heel strike RLE and increased upright trunk.  Adjusted parameters but pt demonstrated increased difficulty with hip flexion during swing phase due to earlier hamstring activation and will likely need to decrease at next session    Ambulation Distance (Feet)  200 Feet    Assistive device  None    Gait Pattern  Step-through pattern;Decreased step length - right;Decreased stride length;Decreased hip/knee flexion - right;Decreased weight shift to left;Right steppage    Ambulation Surface  Level;Indoor      Modalities   Modalities  Teacher, English as a foreign language Location  R anterior tibialis and R hamstring muscles    Electrical Stimulation Action  open and closed chain ankle DF, knee flexion and hip extension    Electrical Stimulation Parameters  See adjusted parameters in Tablet 1; changed to steering electrode for LE    Electrical Stimulation Goals  Strength;Neuromuscular facilitation          Balance Exercises - 08/23/17 0857      Balance Exercises: Standing  Rockerboard  Anterior/posterior;Lateral;EO;UE support Bioness training mode; squats forwards, lateral    Tandem Gait  Forward;Upper extremity support;5 reps Bioness training mode, decreasing UE support    Other Standing Exercises  With use of Bioness in training mode performed on rockerboard (see above) forward squats with posterior <> anterior weight shifting to facilitate full ankle ROM and cues to maintain COG/pelvis in midline and femur in neutral rotation for increased glute activation; during lateral squats to L and R focused on lateral weight shifting of pelvis and activation of glutes to control weight shift and return to midline.  Also with Bioness in training mode performed braiding/stepping over to L and R x 3 reps each direction to continue to focus on weight shifting and proximal hip strengthening and control bilat UE support          PT Short Term Goals -  08/02/17 2128      PT SHORT TERM GOAL #1   Title  Pt will participate in further assessment of balance and gait impairments with BERG and DGI    Time  4    Period  Weeks    Status  Achieved      PT SHORT TERM GOAL #2   Title  Pt will demonstrate increased gait speed to >/= 2.3 ft/sec with cane and Bioness    Baseline  1.8 ft/sec decreased after hospital stay    Time  4    Period  Weeks    Status  Not Met      PT SHORT TERM GOAL #3   Title  Pt will ambulate 350' over indoor surfaces and will negotiate 4 stairs with one rail, alternating sequence MOD I with cane and Bioness with improved R foot clearance, heel strike at initial contact, and decreased recurvatum during stance    Time  4    Period  Weeks    Status  Achieved      PT SHORT TERM GOAL #4   Title  Will initiate and submit paperwork for home Bioness unit    Time  4    Period  Weeks    Status  Achieved        PT Long Term Goals - 07/30/17 1345      PT LONG TERM GOAL #1   Title  Pt will demonstrate independence with LE strengthening and balance HEP    Baseline  no HEP to date    Time  8    Period  Weeks    Status  On-going    Target Date  08/31/17      PT LONG TERM GOAL #2   Title  If approved for home Bioness unit; pt will demonstrate independence with donning, care, skin check and wear schedule    Baseline  dependent to date    Time  8    Period  Weeks    Status  New    Target Date  08/31/17      PT LONG TERM GOAL #3   Title  Pt will improve gait velocity to >/= 2.0 ft/sec to improve safety as community ambulator    Baseline  decreased to 1.89 ft/sec after hospital stay    Time  8    Period  Weeks    Status  Revised    Target Date  08/31/17      PT LONG TERM GOAL #4   Title  Pt will improve DGI by 4 points (19/24) to decrease falls risk  Baseline  15/24    Time  8    Period  Weeks    Status  Revised    Target Date  08/31/17      PT LONG TERM GOAL #5   Title  Pt will improve BERG score by 8  points to decrease falls risk    Baseline  BERG 36/56    Time  8    Period  Weeks    Status  Revised    Target Date  08/31/17      PT LONG TERM GOAL #6   Title  Pt will ambulate >200' over indoor surfaces without cane; 250' over outdoor paved surfaces with cane; negotiate 4 stairs with one rail, alternating sequence, NO cane at MOD I with improved RLE mechanics    Baseline  R genu recurvatum, decreased R hip and knee flexion during swing, foot flat at initial contact    Time  8    Period  Weeks    Status  On-going    Target Date  08/31/17            Plan - 08/23/17 0909    Clinical Impression Statement  Continued NMR, balance gait training returning to use of Bioness today and initiated gait training without AD over ground.  Will continue to focus on postural control and proximal hip stability during gait tomorrow but will make further adjustments to hamstring activation and will begin to assess LTG with plan to recertify for further therapy sessions.    Rehab Potential  Good    PT Frequency  2x / week    PT Duration  8 weeks    PT Treatment/Interventions  ADLs/Self Care Home Management;Electrical Stimulation;DME Instruction;Gait training;Stair training;Functional mobility training;Therapeutic activities;Therapeutic exercise;Balance training;Neuromuscular re-education;Patient/family education;Passive range of motion    PT Next Visit Plan  elliptical/step ups/gait without cane with Bioness, stairs with Bioness, Treadmill with Bioness.  tandem gait and step overs for proximal hip and balance trianing.  sidelying hip ABD training.  Begin to assess LTG    PT Home Exercise Plan  added Rt hamstring and heel cord stretches to HEP    Consulted and Agree with Plan of Care  Patient;Family member/caregiver       Patient will benefit from skilled therapeutic intervention in order to improve the following deficits and impairments:  Abnormal gait, Decreased balance, Decreased strength, Difficulty  walking, Impaired sensation, Pain, Impaired tone  Visit Diagnosis: Foot drop, right  Other disturbances of skin sensation  Hemiplegia and hemiparesis following cerebral infarction affecting right dominant side (HCC)  Other abnormalities of gait and mobility  Unsteadiness on feet     Problem List Patient Active Problem List   Diagnosis Date Noted  . Abdominal pain   . Weakness   . CVA (cerebral vascular accident) ([place]) 07/25/2017  . Right sided weakness 07/25/2017  . Hypokalemia 07/25/2017  . Neck pain on right side 07/25/2017  . Right-sided face pain 07/25/2017  . RUQ abdominal pain 07/25/2017  . Hypertension   . HLD (hyperlipidemia)   . Hypothyroidism     Rico Junker, PT, DPT 08/23/17    9:14 AM    [facility] [address] [place], [place], [address] Phone: [phone]   Fax:  [phone]  Name: Kristin Mahoney MRN: [id number] Date of Birth: 1956-03-27

## 2017-08-23 NOTE — Patient Instructions (Signed)
Home Exercise program for your arm: these are on your phone as well as a video.    Lay on your back with a beach ball between your hands.  Place a bath towel folded in quarters under your shoulder blade.    1. Start with the ball on your chest. Slowly raise it up toward the ceiling until your elbows are straight. HOLD FOR A SLOW COUNT OF 5 then return the ball to your chest. Do 10, rest then do 10 more.  Do 2 times per day.  2. Same position as #1. Raise ball toward the ceiling until elbows are straight.  KEEPING YOUR ELBOWS STRAIGHT, slowly bring the ball over your head as far as you can WITHOUT SHOULDER PAIN!Marland Kitchen. Then return ball back over your chest.  Do 10, rest then do 10 more. Do 2 times per day.     Bed positioning: You like to lay on your left side. Take pillows or blankets and place on the bed next to your belly/chest - build them up so that your elbow is level with your shoulder.This is also on your phone as a picture.

## 2017-08-23 NOTE — Therapy (Signed)
[facility] [address] [place], [place], [address] Phone: [phone]   Fax:  [phone]  Occupational Therapy Treatment  Patient Details  Name: Kristin Mahoney MRN: [id number] Date of Birth: 03/25/1956 Referring Provider: Cathlean CowerVamsee Paruchuri,MD   Encounter Date: 08/23/2017  OT End of Session - 08/23/17 1318    Visit Number  2    Number of Visits  24    Date for OT Re-Evaluation  11/08/17    Authorization Type  Medicare    Authorization Time Period  90 days    OT Start Time  1016    OT Stop Time  1059    OT Time Calculation (min)  43 min    Activity Tolerance  Patient tolerated treatment well       Past Medical History:  Diagnosis Date  . HLD (hyperlipidemia)   . Hypertension   . Hypothyroidism   . Stroke ([facility])   . Thyroid disease     Past Surgical History:  Procedure Laterality Date  . APPENDECTOMY      There were no vitals filed for this visit.  Subjective Assessment - 08/23/17 1023    Subjective   My R shoulder hurts when I wake up    Pertinent History  Original stroke in 2011 but pt had recent admission on 07/25/2017-07/26/2017 with increased weakness. Imaging was negative.      Patient Stated Goals  I want the pain to go away so I can move the right arm better - to reach into cabinets in my kitchen without pain.    Currently in Pain?  Yes    Pain Score  2     Pain Location  Shoulder    Pain Orientation  Right    Pain Descriptors / Indicators  Discomfort    Pain Type  Chronic pain    Pain Onset  1 to 4 weeks ago    Pain Frequency  Constant    Aggravating Factors   Not taking pain meds, over exercising                   OT Treatments/Exercises (OP) - 08/23/17 1312      ADLs   ADL Comments  Reviewed goals and POC and pt in agreement. Instructed pt in bed positioning for RUE - pt able to return demonstrate and verbalized undestanding      Neurological Re-education Exercises   Other Exercises 1  Neuro re ed to address development of HEP for RUE - pt able to return demonstrate - see pt instruction section for details.  Pt reported that she goes to the gym and uses weights for RUE - discussed why that is contraindicated and is likley contributing to pain. Pt verbalized understanding and knows recommendation is to follow therapuetic HEP for now .           Balance Exercises - 08/23/17 0857      Balance Exercises: Standing   Rockerboard  Anterior/posterior;Lateral;EO;UE support Bioness training mode; squats forwards, lateral    Tandem Gait  Forward;Upper extremity support;5 reps Bioness training mode, decreasing UE support    Other Standing Exercises  With use of Bioness in training mode performed on rockerboard (see above) forward squats with posterior <> anterior weight shifting to facilitate full ankle ROM and cues to maintain COG/pelvis in midline and femur in neutral rotation for increased glute activation; during lateral squats to L and R focused on lateral weight shifting of pelvis and activation of glutes  to control weight shift and return to midline.  Also with Bioness in training mode performed braiding/stepping over to L and R x 3 reps each direction to continue to focus on weight shifting and proximal hip strengthening and control bilat UE support        OT Education - 08/23/17 1315    Education provided  Yes    Education Details  bed positioning, HEP for RUE    Person(s) Educated  Patient    Methods  Explanation;Demonstration;Handout    Comprehension  Verbalized understanding;Returned demonstration       OT Short Term Goals - 08/23/17 1315      OT SHORT TERM GOAL #1   Title  Pt will be mod I with HEP for RUE ROM/strength, coordination, and grip strength - 09/13/2017    Status  On-going      OT SHORT TERM GOAL #2   Title  Pt will be able to tolerate reaching to 110* of shoulder flexion to pick up light object  with no more than 2/10 pain    Status   On-going      OT SHORT TERM GOAL #3   Title  Pt will demonstrate improved R grip strength by at least 5 pounds to assist with opening jars when cooking (baseline= 40)    Status  On-going      OT SHORT TERM GOAL #4   Title  Pt will report greater ease in buttoning.     Status  On-going      OT SHORT TERM GOAL #5   Title  Pt will demonstrate improved postural alignment in standing to decrease R shoulder pain with overhead reaching activities    Status  On-going        OT Long Term Goals - 08/16/17 1343      OT LONG TERM GOAL #1   Title  Pt will be mod I with upgraded HEP for proximal strength and reach - 11/08/2017    Status  New      OT LONG TERM GOAL #2   Title  Pt will demonstrate ability to reach into overhead cabinet in kitchen with RUE to obtain dishes or food with no more than 2/10 pain    Status  New      OT LONG TERM GOAL #3   Title  Pt will demonstrate improved grip strength by at least 7 pounds R hand to assist with home mgmt tasks (baseline = 40)    Status  New      OT LONG TERM GOAL #4   Title  Pt will demonstrate ability to get change out of pocket using R hand    Status  New      OT LONG TERM GOAL #5   Title  Pt will demonstrate improved postural aligment with functional ambulation to decrease pain in R shoulder and improve ability to carry at least a 2 pound object when walking.    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 Abdominal pain   . Weakness   . CVA (cerebral vascular accident) ([facility]) 07/25/2017  . Right sided weakness 07/25/2017  . Hypokalemia 07/25/2017  . Neck pain on right side 07/25/2017  . Right-sided face pain 07/25/2017  . RUQ abdominal pain 07/25/2017  . Hypertension   . HLD (hyperlipidemia)   . Hypothyroidism     Norton Pastel, OTR/L 08/23/2017, 2:57 PM  [facility] [address] [place], [place], [address] Phone: [phone]   Fax:  [phone]  Name: Kristin Mahoney MRN: [id number] Date of Birth: 1956/04/18

## 2017-08-24 ENCOUNTER — Ambulatory Visit: Payer: Medicare HMO | Admitting: Occupational Therapy

## 2017-08-24 ENCOUNTER — Encounter: Payer: Self-pay | Admitting: Occupational Therapy

## 2017-08-24 DIAGNOSIS — M25511 Pain in right shoulder: Secondary | ICD-10-CM

## 2017-08-24 DIAGNOSIS — R2681 Unsteadiness on feet: Secondary | ICD-10-CM

## 2017-08-24 DIAGNOSIS — R208 Other disturbances of skin sensation: Principal | ICD-10-CM

## 2017-08-24 DIAGNOSIS — M25611 Stiffness of right shoulder, not elsewhere classified: Secondary | ICD-10-CM

## 2017-08-24 DIAGNOSIS — M6281 Muscle weakness (generalized): Secondary | ICD-10-CM

## 2017-08-24 DIAGNOSIS — I69351 Hemiplegia and hemiparesis following cerebral infarction affecting right dominant side: Secondary | ICD-10-CM | POA: Diagnosis not present

## 2017-08-24 DIAGNOSIS — I69353 Hemiplegia and hemiparesis following cerebral infarction affecting right non-dominant side: Secondary | ICD-10-CM

## 2017-08-24 DIAGNOSIS — G8929 Other chronic pain: Secondary | ICD-10-CM

## 2017-08-24 NOTE — Therapy (Signed)
[facility] [address] [place], [place], [address] Phone: [phone]   Fax:  [phone]  Occupational Therapy Treatment  Patient Details  Name: Kristin KaiserCatherine Levee MRN: [id number] Date of Birth: 04/13/1956 Referring Provider: Cathlean CowerVamsee Paruchuri,MD   Encounter Date: 08/24/2017  OT End of Session - 08/24/17 1258    Visit Number  3    Number of Visits  24    Date for OT Re-Evaluation  11/08/17    Authorization Type  Medicare    Authorization Time Period  90 days    OT Start Time  0802    OT Stop Time  0845    OT Time Calculation (min)  43 min    Activity Tolerance  Patient tolerated treatment well       Past Medical History:  Diagnosis Date  . HLD (hyperlipidemia)   . Hypertension   . Hypothyroidism   . Stroke (HCC)   . Thyroid disease     Past Surgical History:  Procedure Laterality Date  . APPENDECTOMY      There were no vitals filed for this visit.  Subjective Assessment - 08/24/17 0806    Subjective   I don't have any pain when I reach up like that    Patient is accompained by:  Family member boyfriend    Pertinent History  Original stroke in 2011 but pt had recent admission on 07/25/2017-07/26/2017 with increased weakness. Imaging was negative.      Patient Stated Goals  I want the pain to go away so I can move the right arm better - to reach into cabinets in my kitchen without pain.    Currently in Pain?  Yes    Pain Score  4     Pain Location  Arm    Pain Orientation  Right    Pain Descriptors / Indicators  Sore muscle soreness from the exercise yesterday    Pain Type  Acute pain    Pain Onset  Today    Pain Frequency  Constant    Aggravating Factors   no joint pain just muscle soreness - when I went home yesterday I did more of those arm exercises    Pain Relieving Factors  not sure.                     OT Treatments/Exercises (OP) - 08/24/17 0001      Neurological Re-education  Exercises   Other Exercises 1  Neuro re ed in sidelying and sitting to address assisting pt in learning to drop her shoulder prior to reach.  Pt "holds" through shoulder girdles to stabilize against gravity. Pt able to actively release shoulder when cued and then use release for work toward more normal movement pattern for overhead reach in sidelying on moveable surface with min facilitation.  Transitioned into sitting and worked on bilateral closed chain overhead reach with min facilitation and addressing lower trunk initiated control.  Pt able to achieve overhead with no pain with min facilitation.       Manual Therapy   Manual Therapy  Joint mobilization;Soft tissue mobilization;Scapular mobilization    Manual therapy comments  joint, soft tissue and scapular mob to address malalignment in R shoulder girdle - pt with scapula elevated, abducted and upwardly rotated. Humeral head anterior and superior.  Addressed in supine and sidelying prior to neuro re ed. Pt tolerated well.              OT Education -  08/23/17 1315    Education provided  Yes    Education Details  bed positioning, HEP for RUE    Person(s) Educated  Patient    Methods  Explanation;Demonstration;Handout    Comprehension  Verbalized understanding;Returned demonstration       OT Short Term Goals - 08/24/17 1256      OT SHORT TERM GOAL #1   Title  Pt will be mod I with HEP for RUE ROM/strength, coordination, and grip strength - 09/13/2017    Status  On-going      OT SHORT TERM GOAL #2   Title  Pt will be able to tolerate reaching to 110* of shoulder flexion to pick up light object  with no more than 2/10 pain    Status  On-going      OT SHORT TERM GOAL #3   Title  Pt will demonstrate improved R grip strength by at least 5 pounds to assist with opening jars when cooking (baseline= 40)    Status  On-going      OT SHORT TERM GOAL #4   Title  Pt will report greater ease in buttoning.     Status  On-going      OT  SHORT TERM GOAL #5   Title  Pt will demonstrate improved postural alignment in standing to decrease R shoulder pain with overhead reaching activities    Status  On-going        OT Long Term Goals - 08/24/17 1256      OT LONG TERM GOAL #1   Title  Pt will be mod I with upgraded HEP for proximal strength and reach - 11/08/2017    Status  On-going      OT LONG TERM GOAL #2   Title  Pt will demonstrate ability to reach into overhead cabinet in kitchen with RUE to obtain dishes or food with no more than 2/10 pain    Status  On-going      OT LONG TERM GOAL #3   Title  Pt will demonstrate improved grip strength by at least 7 pounds R hand to assist with home mgmt tasks (baseline = 40)    Status  On-going      OT LONG TERM GOAL #4   Title  Pt will demonstrate ability to get change out of pocket using R hand    Status  On-going      OT LONG TERM GOAL #5   Title  Pt will demonstrate improved postural aligment with functional ambulation to decrease pain in R shoulder and improve ability to carry at least a 2 pound object when walking.    Status  On-going            Plan - 08/24/17 1257    Clinical Impression Statement  Pt progressing toward goals. Pt with improving pain with reach with RUE    Occupational Profile and client history currently impacting functional performance  L MCA CVA 2011, HTN, thyroid disease, HLD, chronic shoulder pain, recent episode of increased R weakness    Occupational performance deficits (Please refer to evaluation for details):  ADL's;IADL's;Rest and Sleep;Leisure;Social Participation    Rehab Potential  Good    Current Impairments/barriers affecting progress:  chronic nature of L shoulder pain, length of time since inital onset of stroke    OT Frequency  2x / week    OT Duration  12 weeks    OT Treatment/Interventions  Self-care/ADL training;Aquatic Therapy;Cryotherapy;Moist Heat;Electrical Stimulation;DME and/or AE instruction;Neuromuscular  education;Therapeutic  exercise;Functional Mobility Training;Manual Therapy;Passive range of motion;Splinting;Therapeutic activities;Balance training;Patient/family education    Plan  address R shoulder pain, manual therapy for alignment, NMR for RUE/trunk, balance, bed positioning for R shoulder pain, grip strength    Consulted and Agree with Plan of Care  Patient;Other (Comment) boyfriend       Patient will benefit from skilled therapeutic intervention in order to improve the following deficits and impairments:  Abnormal gait, Decreased activity tolerance, Decreased balance, Decreased coordination, Decreased knowledge of use of DME, Decreased mobility, Decreased range of motion, Difficulty walking, Decreased strength, Impaired UE functional use, Impaired tone, Pain  Visit Diagnosis: Other disturbances of skin sensation  Unsteadiness on feet  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 Abdominal pain   . Weakness   . CVA (cerebral vascular accident) (HCC) 07/25/2017  . Right sided weakness 07/25/2017  . Hypokalemia 07/25/2017  . Neck pain on right side 07/25/2017  . Right-sided face pain 07/25/2017  . RUQ abdominal pain 07/25/2017  . Hypertension   . HLD (hyperlipidemia)   . Hypothyroidism     Norton Pastel, OTR/L 08/24/2017, 12:59 PM  [facility] [address] [place], [place], [address] Phone: [phone]   Fax:  [phone]  Name: Allayna Erlich MRN: [id number] Date of Birth: 1955-09-28

## 2017-08-27 ENCOUNTER — Encounter: Payer: Self-pay | Admitting: Physical Therapy

## 2017-08-27 ENCOUNTER — Ambulatory Visit: Payer: Medicare HMO | Admitting: Physical Therapy

## 2017-08-27 DIAGNOSIS — I69351 Hemiplegia and hemiparesis following cerebral infarction affecting right dominant side: Secondary | ICD-10-CM | POA: Diagnosis not present

## 2017-08-27 DIAGNOSIS — R208 Other disturbances of skin sensation: Secondary | ICD-10-CM

## 2017-08-27 DIAGNOSIS — R2689 Other abnormalities of gait and mobility: Secondary | ICD-10-CM

## 2017-08-27 DIAGNOSIS — M21371 Foot drop, right foot: Secondary | ICD-10-CM

## 2017-08-27 DIAGNOSIS — R2681 Unsteadiness on feet: Secondary | ICD-10-CM

## 2017-08-28 NOTE — Therapy (Signed)
[facility] [address] [place], [place], [address] Phone: [phone]   Fax:  [phone]  Physical Therapy Treatment  Patient Details  Name: Kristin Mahoney MRN: [id number] Date of Birth: 07-30-1955 Referring Provider: Latricia Heft Paruchuri,MD   Encounter Date: 08/27/2017  PT End of Session - 08/28/17 1443    Visit Number  12    Number of Visits  17    Date for PT Re-Evaluation  08/31/17    Authorization Type  [employer]    PT Start Time  0805    PT Stop Time  0846    PT Time Calculation (min)  41 min    Activity Tolerance  Patient tolerated treatment well    Behavior During Therapy  [facility] for tasks assessed/performed       Past Medical History:  Diagnosis Date  . HLD (hyperlipidemia)   . Hypertension   . Hypothyroidism   . Stroke ([place])   . Thyroid disease     Past Surgical History:  Procedure Laterality Date  . APPENDECTOMY      There were no vitals filed for this visit.  Subjective Assessment - 08/27/17 0807    Subjective  Pt had a hard time sleeping last night so did OT exercises; UE is a little sore from exercises but doing well.    Patient is accompained by:  Family member    Pertinent History  HTN, CVA, thyroid disease    Limitations  Walking    Patient Stated Goals  Use the Bioness for her RLE and stop using the cane.    Currently in Pain?  Yes    Pain Score  4     Pain Location  Arm    Pain Orientation  Right    Pain Descriptors / Indicators  Sore    Pain Onset  More than a month ago         [facility] PT Assessment - 08/27/17 0810      Ambulation/Gait   Ambulation/Gait  Yes    Ambulation/Gait Assistance  4: Min assist    Ambulation/Gait Assistance Details  no cane or Bioness; RLE fatigue, trunk flexion    Ambulation Distance (Feet)  230 Feet    Assistive device  None    Ambulation Surface  Level;Indoor      Standardized Balance Assessment   Standardized Balance Assessment   Berg Balance Test;Dynamic Gait Index;10 meter walk test    10 Meter Walk  16.59 with cane (no Bioness) 1.37f/sec ; 21.59 without cane (no Bioness) 1.5 ft/sec       Berg Balance Test   Sit to Stand  Able to stand without using hands and stabilize independently    Standing Unsupported  Able to stand safely 2 minutes    Sitting with Back Unsupported but Feet Supported on Floor or Stool  Able to sit safely and securely 2 minutes    Stand to Sit  Sits safely with minimal use of hands    Transfers  Able to transfer safely, minor use of hands    Standing Unsupported with Eyes Closed  Able to stand 10 seconds safely    Standing Ubsupported with Feet Together  Able to place feet together independently and stand 1 minute safely    From Standing, Reach Forward with Outstretched Arm  Can reach forward >12 cm safely (5")    From Standing Position, Pick up Object from Floor  Able to pick up shoe safely and easily  From Standing Position, Turn to Look Behind Over each Shoulder  Looks behind from both sides and weight shifts well    Turn 360 Degrees  Able to turn 360 degrees safely but slowly    Standing Unsupported, Alternately Place Feet on Step/Stool  Able to complete >2 steps/needs minimal assist    Standing Unsupported, One Foot in Front  Able to take small step independently and hold 30 seconds    Standing on One Leg  Tries to lift leg/unable to hold 3 seconds but remains standing independently    Total Score  45    Berg comment:  45/56      Dynamic Gait Index   Level Surface  Mild Impairment    Change in Gait Speed  Mild Impairment    Gait with Horizontal Head Turns  Mild Impairment    Gait with Vertical Head Turns  Mild Impairment    Gait and Pivot Turn  Mild Impairment    Step Over Obstacle  Mild Impairment    Step Around Obstacles  Mild Impairment    Steps  Mild Impairment    Total Score  16    DGI comment:  16/24                           PT Education - 08/28/17  1443    Education provided  Yes    Education Details  progress towards LTG; plan for recertification    Person(s) Educated  Patient    Methods  Explanation    Comprehension  Verbalized understanding       PT Short Term Goals - 08/02/17 2128      PT SHORT TERM GOAL #1   Title  Pt will participate in further assessment of balance and gait impairments with BERG and DGI    Time  4    Period  Weeks    Status  Achieved      PT SHORT TERM GOAL #2   Title  Pt will demonstrate increased gait speed to >/= 2.3 ft/sec with cane and Bioness    Baseline  1.8 ft/sec decreased after hospital stay    Time  4    Period  Weeks    Status  Not Met      PT SHORT TERM GOAL #3   Title  Pt will ambulate 350' over indoor surfaces and will negotiate 4 stairs with one rail, alternating sequence MOD I with cane and Bioness with improved R foot clearance, heel strike at initial contact, and decreased recurvatum during stance    Time  4    Period  Weeks    Status  Achieved      PT SHORT TERM GOAL #4   Title  Will initiate and submit paperwork for home Bioness unit    Time  4    Period  Weeks    Status  Achieved        PT Long Term Goals - 08/27/17 9024      PT LONG TERM GOAL #1   Title  Pt will demonstrate independence with LE strengthening and balance HEP    Time  8    Period  Weeks    Status  On-going      PT LONG TERM GOAL #2   Title  If approved for home Bioness unit; pt will demonstrate independence with donning, care, skin check and wear schedule    Baseline  dependent to date  Time  8    Period  Weeks    Status  On-going      PT LONG TERM GOAL #3   Title  Pt will improve gait velocity to >/= 2.0 ft/sec to improve safety as community ambulator    Baseline  1.9 ft/sec with cane, 1.5 without cane    Time  8    Period  Weeks    Status  Partially Met      PT LONG TERM GOAL #4   Title  Pt will improve DGI by 4 points to decrease falls risk    Baseline  11/24 > 15/24 > 16/24     Time  8    Period  Weeks    Status  Achieved      PT LONG TERM GOAL #5   Title  Pt will improve BERG score by 8 points to decrease falls risk    Baseline  BERG 36/56 > 45/56    Time  8    Period  Weeks    Status  Achieved      PT LONG TERM GOAL #6   Title  Pt will ambulate >200' over indoor surfaces without cane; 250' over outdoor paved surfaces with cane; negotiate 4 stairs with one rail, alternating sequence, NO cane at MOD I with improved RLE mechanics    Baseline  stairs MOD I; gait without cane min A due to RLE fatigue    Time  8    Period  Weeks    Status  Partially Met            Plan - 08/28/17 1445    Clinical Impression Statement  Initiatied assessment of progress towards LTG with re-assessment of outcome measures with and without cane and without Bioness to determine carry over.  Pt's gait velocity with cane has returned to baseline (had decreased slightly after hospital stay); pt is demonstrating significant improvements in standing balance without UE support and safety with gait during higher level community challenges but continues to require cane for safety.  Will continue to assess and plan to recertify at next visit.  Pt will waiting to hear if she has been approved for Bioness home unit.    Rehab Potential  Good    PT Frequency  2x / week    PT Duration  8 weeks    PT Treatment/Interventions  ADLs/Self Care Home Management;Electrical Stimulation;DME Instruction;Gait training;Stair training;Functional mobility training;Therapeutic activities;Therapeutic exercise;Balance training;Neuromuscular re-education;Patient/family education;Passive range of motion    PT Next Visit Plan  Finish LTG (gait outside with Bioness, HEP); RECERTIFY; elliptical/step ups/gait without cane with Bioness, stairs with Bioness, Treadmill with Bioness.  tandem gait and step overs for proximal hip and balance trianing.  sidelying hip ABD training.    PT Home Exercise Plan  added Rt hamstring and  heel cord stretches to HEP    Consulted and Agree with Plan of Care  Patient;Family member/caregiver       Patient will benefit from skilled therapeutic intervention in order to improve the following deficits and impairments:  Abnormal gait, Decreased balance, Decreased strength, Difficulty walking, Impaired sensation, Pain, Impaired tone  Visit Diagnosis: Hemiplegia and hemiparesis following cerebral infarction affecting right dominant side (HCC)  Foot drop, right  Other abnormalities of gait and mobility  Other disturbances of skin sensation  Unsteadiness on feet     Problem List Patient Active Problem List   Diagnosis Date Noted  . Abdominal pain   . Weakness   .  CVA (cerebral vascular accident) ([place]) 07/25/2017  . Right sided weakness 07/25/2017  . Hypokalemia 07/25/2017  . Neck pain on right side 07/25/2017  . Right-sided face pain 07/25/2017  . RUQ abdominal pain 07/25/2017  . Hypertension   . HLD (hyperlipidemia)   . Hypothyroidism     Rico Junker, PT, DPT 08/28/17    2:53 PM    [facility] [address] [place], [place], [address] Phone: [phone]   Fax:  [phone]  Name: Chidinma Clites MRN: [id number] Date of Birth: 1955-07-22

## 2017-08-30 ENCOUNTER — Encounter: Payer: Self-pay | Admitting: Physical Therapy

## 2017-08-30 ENCOUNTER — Ambulatory Visit: Payer: Medicare HMO | Admitting: Physical Therapy

## 2017-08-30 DIAGNOSIS — M21371 Foot drop, right foot: Secondary | ICD-10-CM

## 2017-08-30 DIAGNOSIS — R208 Other disturbances of skin sensation: Secondary | ICD-10-CM

## 2017-08-30 DIAGNOSIS — R2681 Unsteadiness on feet: Secondary | ICD-10-CM

## 2017-08-30 DIAGNOSIS — I69353 Hemiplegia and hemiparesis following cerebral infarction affecting right non-dominant side: Secondary | ICD-10-CM

## 2017-08-30 DIAGNOSIS — I69351 Hemiplegia and hemiparesis following cerebral infarction affecting right dominant side: Secondary | ICD-10-CM | POA: Diagnosis not present

## 2017-08-30 DIAGNOSIS — R2689 Other abnormalities of gait and mobility: Secondary | ICD-10-CM

## 2017-08-30 NOTE — Patient Instructions (Signed)
Gastroc / Heel Cord Stretch - On Step    Stand with heels over edge of stair. Holding rail, lower heels until stretch is felt in calf of legs. Repeat _1__ times. Do _2-3__ times per day.  HOLD 30-45 secs   Stretching: Gastroc    Stand with right foot back, leg straight, forward leg bent. Keeping heel on floor, turned slightly out, lean into wall until stretch is felt in calf. Hold _30-45___ seconds. Repeat _1___ times per set. Do _1___ sets per session. Do _2-3___ sessions per day.   Bridging    Slowly raise buttocks from floor, keeping stomach tight. Repeat _5-8___ times per set.  Do __2__ sessions per day.  Bracing With March in Bridging (Hook-Lying)    With neutral spine, tighten pelvic floor and abdominals and hold. Lift bottom and hold, then march in place, right foot up and then left foot up, lower hips. March _5-8__ times. Do _2__ times a day.    Hamstrings    Lie on stomach with _2lb___ pound weight around left ankle. Keep the front of your hip flat on the bed.  As you lower your right leg, raise the left heel.  Keep alternating. Hold __2-3__ seconds. Repeat __10__ times.  CAUTION: Move slowly.  Copyright  [employer]. All rights reserved.    Walking on Heels    Walk on heels along counter top; one hand holding countertop, other hand holding cane while continuing on a straight path.  Walk backwards regular and start again. Do 4  Laps.

## 2017-08-30 NOTE — Therapy (Signed)
[facility] [address] [place], [place], [address] Phone: [phone]   Fax:  [phone]  Physical Therapy Treatment  Patient Details  Name: Kristin Mahoney MRN: [id number] Date of Birth: [birth year] Referring Provider: Lorelei Pont, MD   Encounter Date: 08/30/2017  PT End of Session - 08/30/17 0957    Visit Number  13    Number of Visits  17    Date for PT Re-Evaluation  08/31/17    Authorization Type  [employer] H[employer]    PT Start Time  0805    PT Stop Time  0845    PT Time Calculation (min)  40 min    Equipment Utilized During Treatment  Other (comment)    Activity Tolerance  Patient tolerated treatment well    Behavior During Therapy  [facility] for tasks assessed/performed       Past Medical History:  Diagnosis Date  . HLD (hyperlipidemia)   . Hypertension   . Hypothyroidism   . Stroke ([place])   . Thyroid disease     Past Surgical History:  Procedure Laterality Date  . APPENDECTOMY      There were no vitals filed for this visit.  Subjective Assessment - 08/30/17 0810    Subjective  Tired today; stayed up until 1230 watching a series on Netflix.      Patient is accompained by:  Family member    Pertinent History  HTN, CVA, thyroid disease    Limitations  Walking    Patient Stated Goals  Use the Bioness for her RLE and stop using the cane.    Currently in Pain?  No/denies         [facility] PT Assessment - 08/30/17 0814      Assessment   Medical Diagnosis  Recent increase in L weakness, orignal stroke in 2011    Referring Provider  Lorelei Pont, MD    Onset Date/Surgical Date  07/15/17    Hand Dominance  Left      Prior Function   Level of Independence  Independent      Observation/Other Assessments   Focus on Therapeutic Outcomes (FOTO)   not indicated; CVA chronic      Ambulation/Gait   Ambulation/Gait  Yes    Ambulation/Gait Assistance  6: Modified independent (Device/Increase  time);4: Min guard    Ambulation/Gait Assistance Details  Min guard ambulating outside over pavement without use of cane but with Bioness; MOD I with cane and Bioness over pavement    Ambulation Distance (Feet)  200 Feet    Assistive device  Straight cane;None;Other (Comment) Bioness    Gait Pattern  Step-through pattern;Decreased step length - right;Decreased stride length;Decreased weight shift to left;Right steppage    Ambulation Surface  Unlevel;Outdoor;Paved    Ramp  5: Supervision    Ramp Details (indicate cue type and reason)  with and without cane but with Bioness outside on pavement    Curb  5: Supervision    Curb Details (indicate cue type and reason)  with cane and Bioness outside       Reviewed the following exercises for HEP with use of Bioness in training mode:  Bridging    Slowly raise buttocks from floor, keeping stomach tight. Repeat _5-8___ times per set.  Do __2__ sessions per day.  Bracing With March in Bridging (Hook-Lying)    With neutral spine, tighten pelvic floor and abdominals and hold. Lift bottom and hold, then march in place, right foot up and then  left foot up, lower hips. March _5-8__ times. Do _2__ times a day.    Hamstrings    Lie on stomach with _2lb___ pound weight around left ankle. Keep the front of your hip flat on the bed.  As you lower your right leg, raise the left heel.  Keep alternating. Hold __2-3__ seconds. Repeat __10__ times.  CAUTION: Move slowly.    PT Education - 08/30/17 0956    Education provided  Yes    Education Details  updated HEP    Person(s) Educated  Patient    Methods  Explanation;Demonstration    Comprehension  Need further instruction       PT Short Term Goals - 08/02/17 2128      PT SHORT TERM GOAL #1   Title  Pt will participate in further assessment of balance and gait impairments with BERG and DGI    Time  4    Period  Weeks    Status  Achieved      PT SHORT TERM GOAL #2   Title  Pt will  demonstrate increased gait speed to >/= 2.3 ft/sec with cane and Bioness    Baseline  1.8 ft/sec decreased after hospital stay    Time  4    Period  Weeks    Status  Not Met      PT SHORT TERM GOAL #3   Title  Pt will ambulate 350' over indoor surfaces and will negotiate 4 stairs with one rail, alternating sequence MOD I with cane and Bioness with improved R foot clearance, heel strike at initial contact, and decreased recurvatum during stance    Time  4    Period  Weeks    Status  Achieved      PT SHORT TERM GOAL #4   Title  Will initiate and submit paperwork for home Bioness unit    Time  4    Period  Weeks    Status  Achieved        PT Long Term Goals - 08/30/17 0959      PT LONG TERM GOAL #1   Title  Pt will demonstrate independence with LE strengthening and balance HEP    Time  8    Period  Weeks    Status  On-going      PT LONG TERM GOAL #2   Title  If approved for home Bioness unit; pt will demonstrate independence with donning, care, skin check and wear schedule    Baseline  dependent to date    Time  8    Period  Weeks    Status  On-going      PT LONG TERM GOAL #3   Title  Pt will improve gait velocity to >/= 2.0 ft/sec to improve safety as community ambulator    Baseline  1.9 ft/sec with cane, 1.5 without cane    Time  8    Period  Weeks    Status  Partially Met      PT LONG TERM GOAL #4   Title  Pt will improve DGI by 4 points to decrease falls risk    Baseline  11/24 > 15/24 > 16/24    Time  8    Period  Weeks    Status  Achieved      PT LONG TERM GOAL #5   Title  Pt will improve BERG score by 8 points to decrease falls risk    Baseline  BERG 36/56 > 45/56  Time  8    Period  Weeks    Status  Achieved      PT LONG TERM GOAL #6   Title  Pt will ambulate >200' over indoor surfaces without cane; 250' over outdoor paved surfaces with cane; negotiate 4 stairs with one rail, alternating sequence, NO cane at MOD I with improved RLE mechanics     Baseline  gait outside with supervision-min A; stairs MOD I; gait without cane min A due to RLE fatigue    Time  8    Period  Weeks    Status  Partially Met            Plan - 08/30/17 1000    Clinical Impression Statement  Completed assessment of LTG and began to review HEP with use of BIONESS.  Added prone alternating hamstring curls to HEP and plan to continue to review and progress HEP next session.  Pt is making steady progress and has met 2/6 LTG with first two goals ongoing due to update to HEP and pt has not heard if the home Bioness unit will be approved.  Other 2 goals were partially met with pt demonstrating improvement in gait velocity to baseline value following decline from hospital stay but not to goal level.  Balance has progressed to allow pt to initiate gait training without cane but requires min A due to RLE fatigue.  Pt will benefit from ongoing skilled PT services to continue to address impairments and progress training with Bioness functional estim to maximize functional mobility independence and decrease risk for falls.    Rehab Potential  Good    PT Frequency  2x / week    PT Duration  8 weeks    PT Treatment/Interventions  ADLs/Self Care Home Management;Electrical Stimulation;DME Instruction;Gait training;Stair training;Functional mobility training;Therapeutic activities;Therapeutic exercise;Balance training;Neuromuscular re-education;Patient/family education;Passive range of motion    PT Next Visit Plan  Finish updating HEP and print: sit <> stand staggered stance, standing taps, SLS, tandem??? elliptical/step ups/gait without cane with Bioness, stairs with Bioness, Treadmill with Bioness.  tandem gait and step overs for proximal hip and balance trianing.  sidelying hip ABD training.    Consulted and Agree with Plan of Care  Patient;Family member/caregiver    Family Member Consulted  significant other       Patient will benefit from skilled therapeutic intervention in  order to improve the following deficits and impairments:  Abnormal gait, Decreased balance, Decreased strength, Difficulty walking, Impaired sensation, Pain, Impaired tone  Visit Diagnosis: Hemiplegia and hemiparesis following cerebral infarction affecting right non-dominant side (HCC)  Foot drop, right  Other abnormalities of gait and mobility  Other disturbances of skin sensation  Unsteadiness on feet     Problem List Patient Active Problem List   Diagnosis Date Noted  . Abdominal pain   . Weakness   . CVA (cerebral vascular accident) ([place]) 07/25/2017  . Right sided weakness 07/25/2017  . Hypokalemia 07/25/2017  . Neck pain on right side 07/25/2017  . Right-sided face pain 07/25/2017  . RUQ abdominal pain 07/25/2017  . Hypertension   . HLD (hyperlipidemia)   . Hypothyroidism     Rico Junker, PT, DPT 08/30/17    10:09 AM    [facility] [address] [place], [place], [address] Phone: [phone]   Fax:  [phone]  Name: Kristin Mahoney MRN: [id number] Date of Birth: December 21, 1955

## 2017-09-01 ENCOUNTER — Encounter: Payer: Self-pay | Admitting: Physical Therapy

## 2017-09-01 ENCOUNTER — Encounter: Payer: Self-pay | Admitting: Occupational Therapy

## 2017-09-01 ENCOUNTER — Ambulatory Visit: Payer: Medicare HMO | Admitting: Physical Therapy

## 2017-09-01 ENCOUNTER — Ambulatory Visit: Payer: Medicare HMO | Attending: Internal Medicine | Admitting: Occupational Therapy

## 2017-09-01 DIAGNOSIS — M25511 Pain in right shoulder: Secondary | ICD-10-CM | POA: Diagnosis present

## 2017-09-01 DIAGNOSIS — R2689 Other abnormalities of gait and mobility: Secondary | ICD-10-CM

## 2017-09-01 DIAGNOSIS — M21371 Foot drop, right foot: Secondary | ICD-10-CM | POA: Insufficient documentation

## 2017-09-01 DIAGNOSIS — R208 Other disturbances of skin sensation: Secondary | ICD-10-CM | POA: Diagnosis not present

## 2017-09-01 DIAGNOSIS — M6281 Muscle weakness (generalized): Secondary | ICD-10-CM | POA: Diagnosis present

## 2017-09-01 DIAGNOSIS — G8929 Other chronic pain: Secondary | ICD-10-CM | POA: Diagnosis present

## 2017-09-01 DIAGNOSIS — I69353 Hemiplegia and hemiparesis following cerebral infarction affecting right non-dominant side: Secondary | ICD-10-CM

## 2017-09-01 DIAGNOSIS — R2681 Unsteadiness on feet: Secondary | ICD-10-CM

## 2017-09-01 DIAGNOSIS — M25611 Stiffness of right shoulder, not elsewhere classified: Secondary | ICD-10-CM | POA: Diagnosis present

## 2017-09-01 NOTE — Patient Instructions (Addendum)
Gastroc / Heel Cord Stretch - On Step    Stand with heels over edge of stair. Holding rail, lower heels until stretch is felt in calf of legs. Repeat _1__ times. Do _2-3__ times per day.HOLD 30-45 secs   Stretching: Gastroc    Stand with right foot back, leg straight, forward leg bent. Keeping heel on floor, turned slightly out, lean into wall until stretch is felt in calf. Hold _30-45___ seconds. Repeat _1___ times per set. Do _1___ sets per session. Do _2-3___ sessions per day.   Bridging    Slowly raise buttocks from floor, keeping stomach tight. Repeat _5-8___ times per set. Do __2__ sessions per day.  Bracing With March in Bridging (Hook-Lying)    With neutral spine, tighten pelvic floor and abdominals and hold. Lift bottom and hold, then march in place, right foot up and then left foot up, lower hips. March _5-8__ times. Do _2__ times a day.    Hamstrings    Lie on stomach with _2lb___ pound weight around left ankle. Keep the front of your hip flat on the bed.  As you lower your right leg, raise the left heel.  Keep alternating. Hold __2-3__ seconds. Repeat __10__ times.  CAUTION: Move slowly.   SIT TO STAND: Feet in Modified Tandem    Place left foot forwards, right foot back.  Both hands on cane in front of you. Lean chest forward. Raise hips and straighten knees to stand.  As you sit, keep your weight over the right leg.  Sit slowly by leaning forward with the cane. _10__ reps per set.   Walking on Heels    Walk on heelsalong counter top; one hand holding countertop, other hand holding canewhile continuing on a straight path. KEEP KNEES SLIGHTLY BENT Walk backwards regular and start again. Do4 Laps.    Feet Heel-Toe "Tandem"    One hand holding counter top, other hand holding cane, walk a straight line bringing one foot directly in front of the other. Repeat for 4 laps per session. Do __2__ sessions per day.    FUNCTIONAL  MOBILITY: Marching - Standing    Stand with both hands touching counter top, lower cabinet open. Lift left leg to tap lower shelf bring it back down, then bring right leg to tap lower shelf. Alternate, 10 times total.  On last repetition, hold your balance on one leg and see if you can let go of counter top for 10 seconds; switch legs.  Perform 2 times each leg.  (Picture below)  Single Leg - Eyes Open

## 2017-09-01 NOTE — Therapy (Signed)
[facility] [address] [place], [place], [address] Phone: [phone]   Fax:  [phone]  Physical Therapy Treatment  Patient Details  Name: Kristin Mahoney MRN: [id number] Date of Birth: 1955/11/26 Referring Provider: Dennard Schaumann, MD   Encounter Date: 09/01/2017  PT End of Session - 09/01/17 1258    Visit Number  14    Number of Visits  30    Date for PT Re-Evaluation  10/30/17    Authorization Type  [employer] H[employer]    PT Start Time  [phone]    PT Stop Time  0933    PT Time Calculation (min)  40 min    Activity Tolerance  Patient tolerated treatment well    Behavior During Therapy  [facility] for tasks assessed/performed       Past Medical History:  Diagnosis Date  . HLD (hyperlipidemia)   . Hypertension   . Hypothyroidism   . Stroke ([facility])   . Thyroid disease     Past Surgical History:  Procedure Laterality Date  . APPENDECTOMY      There were no vitals filed for this visit.  Subjective Assessment - 09/01/17 0858    Subjective  Still stayed up until 11 watching movies, less fatigued today.  Just finished with OT, OT recommending pt bring in AFO to assess fit and function due to increased RUE tone during ambulation due to foot drag.  Will bring in AFO next session.    Patient is accompained by:  Family member    Pertinent History  HTN, CVA, thyroid disease    Limitations  Walking    Patient Stated Goals  Use the Bioness for her RLE and stop using the cane.    Currently in Pain?  No/denies      Added and reviewed the following exercises for patient's HEP:  SIT TO STAND: Feet in Modified Tandem    Place left foot forwards, right foot back.  Both hands on cane in front of you. Lean chest forward. Raise hips and straighten knees to stand.  As you sit, keep your weight over the right leg.  Sit slowly by leaning forward with the cane. _10__ reps per set.   Walking on Heels    Walk on  heelsalong counter top; one hand holding countertop, other hand holding canewhile continuing on a straight path. KEEP KNEES SLIGHTLY BENT Walk backwards regular and start again. Do4 Laps.    Feet Heel-Toe "Tandem"    One hand holding counter top, other hand holding cane, walk a straight line bringing one foot directly in front of the other. Repeat for 4 laps per session. Do __2__ sessions per day.    FUNCTIONAL MOBILITY: Marching - Standing    Stand with both hands touching counter top, lower cabinet open. Lift left leg to tap lower shelf bring it back down, then bring right leg to tap lower shelf. Alternate, 10 times total.  On last repetition, hold your balance on one leg and see if you can let go of counter top for 10 seconds; switch legs.  Perform 2 times each leg.  (Picture below)  Single Leg - Eyes Open       [facility] Adult PT Treatment/Exercise - 09/01/17 0925      Ambulation/Gait   Ambulation/Gait  Yes    Ambulation/Gait Assistance  4: Min guard    Ambulation/Gait Assistance Details  use of walking sticks to facilitate reciprocal arm swing and trunk rotation while maintaining RUE in  relaxed extended position during gait    Ambulation Distance (Feet)  115 Feet    Assistive device  Other (Comment) walking sticks for UE swing    Gait Pattern  Step-through pattern;Decreased hip/knee flexion - right;Decreased dorsiflexion - right;Decreased weight shift to left;Poor foot clearance - right    Ambulation Surface  Level;Indoor             PT Education - 09/01/17 1257    Education provided  Yes    Education Details  added to and finalized HEP    Person(s) Educated  Patient    Methods  Explanation;Demonstration;Handout    Comprehension  Verbalized understanding;Returned demonstration       PT Short Term Goals - 08/30/17 1010      PT SHORT TERM GOAL #1   Title  Pt will perform DGI assessment without use of Cane but with Bioness donned    Baseline  DGI  performed with cane, no Bioness    Time  4    Period  Weeks    Status  Revised    Target Date  09/30/17      PT SHORT TERM GOAL #2   Title  Pt will demonstrate increased gait speed to >/= 2.0 ft/sec with cane and Bioness; 1.8 ft/sec without cane (but with Bioness)    Time  4    Period  Weeks    Status  Revised    Target Date  09/30/17      PT SHORT TERM GOAL #3   Title  Pt will ambulate 230' over indoor surfaces and will negotiate 4 stairs with one rail, alternating sequence with supervision, no cane but with Bioness    Time  4    Period  Weeks    Status  Revised    Target Date  09/30/17      PT SHORT TERM GOAL #4   Title  Pt will improve BERG to >50/56    Baseline  45/56    Time  4    Period  Weeks    Status  Revised    Target Date  09/30/17        PT Long Term Goals - 08/30/17 1118      PT LONG TERM GOAL #1   Title  Pt will demonstrate independence with LE strengthening and balance HEP    Time  8    Period  Weeks    Status  On-going    Target Date  10/30/17      PT LONG TERM GOAL #2   Title  If approved for home Bioness unit; pt will demonstrate independence with donning, care, skin check and wear schedule    Time  8    Period  Weeks    Status  On-going    Target Date  10/30/17      PT LONG TERM GOAL #3   Title  Pt will improve gait velocity to >/= 2.0 ft/sec with Bioness but no cane to improve safety as community ambulator    Time  8    Period  Weeks    Status  Revised    Target Date  10/30/17      PT LONG TERM GOAL #4   Title  Pt will improve DGI by 4 points (with Bioness, no Cane) to decrease falls risk    Baseline  TBD without Cane    Time  8    Period  Weeks    Status  Revised  Target Date  10/30/17      PT LONG TERM GOAL #5   Title  Pt will improve BERG score to >/ = 53/56 to decrease falls risk    Time  8    Period  Weeks    Status  Revised    Target Date  10/30/17      PT LONG TERM GOAL #6   Title  WITH BIONESS donned:  ambulate >230'  over indoor surfaces without cane; 200' over outdoor paved surfaces (with cane); negotiate 4 stairs with one rail, alternating sequence, NO cane at MOD I with improved RLE mechanics    Time  8    Period  Weeks    Status  Revised    Target Date  10/30/17            Plan - 09/01/17 1259    Clinical Impression Statement  Continued to review HEP adding more standing balance exercises at counter top focusing on weight shifting, lateral hip strength and single limb stance training.  Also continued gait training without AD but with use of walking sticks to allow therapist to facilitate UE swing and trunk rotation.  Pt tolerated well but did report fatigue by end of session.  Will continue to progress towards LTG.    Rehab Potential  Good    PT Frequency  2x / week    PT Duration  8 weeks    PT Treatment/Interventions  ADLs/Self Care Home Management;Electrical Stimulation;DME Instruction;Gait training;Stair training;Functional mobility training;Therapeutic activities;Therapeutic exercise;Balance training;Neuromuscular re-education;Patient/family education;Passive range of motion    PT Next Visit Plan  HEP with Bioness; elliptical/step ups/gait without cane with Bioness, stairs with Bioness, Treadmill with Bioness.  tandem gait and step overs for proximal hip and balance trianing.  sidelying hip ABD training.    Consulted and Agree with Plan of Care  Patient;Family member/caregiver    Family Member Consulted  significant other       Patient will benefit from skilled therapeutic intervention in order to improve the following deficits and impairments:  Abnormal gait, Decreased balance, Decreased strength, Difficulty walking, Impaired sensation, Pain, Impaired tone  Visit Diagnosis: Hemiplegia and hemiparesis following cerebral infarction affecting right non-dominant side (HCC)  Foot drop, right  Other abnormalities of gait and mobility  Other disturbances of skin sensation  Unsteadiness on  feet     Problem List Patient Active Problem List   Diagnosis Date Noted  . Abdominal pain   . Weakness   . CVA (cerebral vascular accident) ([facility]) 07/25/2017  . Right sided weakness 07/25/2017  . Hypokalemia 07/25/2017  . Neck pain on right side 07/25/2017  . Right-sided face pain 07/25/2017  . RUQ abdominal pain 07/25/2017  . Hypertension   . HLD (hyperlipidemia)   . Hypothyroidism     Dierdre Highman, PT, DPT 09/01/17    1:08 PM    [facility] [address] [place], [place], [address] Phone: [phone]   Fax:  [phone]  Name: Kristin Mahoney MRN: [id number] Date of Birth: 1956-03-16

## 2017-09-01 NOTE — Therapy (Signed)
[facility] [address] [place], [place], [address] Phone: [phone]   Fax:  [phone]  Occupational Therapy Treatment  Patient Details  Name: Kristin Mahoney MRN: [id number] Date of Birth: 1955/12/01 Referring Provider: Dennard Schaumann, MD   Encounter Date: 09/01/2017  OT End of Session - 09/01/17 0917    Visit Number  4    Number of Visits  24    Date for OT Re-Evaluation  11/08/17    Authorization Type  Medicare    Authorization Time Period  90 days    OT Start Time  0802    OT Stop Time  0846    OT Time Calculation (min)  44 min    Activity Tolerance  Patient tolerated treatment well       Past Medical History:  Diagnosis Date  . HLD (hyperlipidemia)   . Hypertension   . Hypothyroidism   . Stroke (HCC)   . Thyroid disease     Past Surgical History:  Procedure Laterality Date  . APPENDECTOMY      There were no vitals filed for this visit.  Subjective Assessment - 09/01/17 0805    Patient is accompained by:  Family member  (Pended)  boy friend    Pertinent History  Original stroke in 2011 but pt had recent admission on 07/25/2017-07/26/2017 with increased weakness. Imaging was negative.    (Pended)     Patient Stated Goals  I want the pain to go away so I can move the right arm better - to reach into cabinets in my kitchen without pain.  (Pended)     Currently in Pain?  No/denies  (Pended)  had shoulder pain this morning 3/10                   OT Treatments/Exercises (OP) - 09/01/17 0001      Neurological Re-education Exercises   Other Exercises 1  Neuro re ed to address proximal R shoulder alignment, activity and strength in sidelying,  sitting and standing.  Pt able to tolerate resistance and light weight with activity with faciliation and cues.  Progressed to unilateral reach in supine in all planes and pt able to self monitor shoulder hike for reaching.  Transitioned into  sitting and addressed overhead bilateral closed chain reach progressing to unilateral open chain overhead reach with RUE.  Pt required min facilitation at scapula and to weight shift to the rigth with overhead lateral reach.  Pt resistant even in sitting to R weight shift. Will continue to address.                OT Short Term Goals - 09/01/17 0913      OT SHORT TERM GOAL #1   Title  Pt will be mod I with HEP for RUE ROM/strength, coordination, and grip strength - 09/13/2017    Status  On-going      OT SHORT TERM GOAL #2   Title  Pt will be able to tolerate reaching to 110* of shoulder flexion to pick up light object  with no more than 2/10 pain    Status  Achieved      OT SHORT TERM GOAL #3   Title  Pt will demonstrate improved R grip strength by at least 5 pounds to assist with opening jars when cooking (baseline= 40)    Status  On-going      OT SHORT TERM GOAL #4   Title  Pt will report greater ease  in buttoning.     Status  On-going      OT SHORT TERM GOAL #5   Title  Pt will demonstrate improved postural alignment in standing to decrease R shoulder pain with overhead reaching activities    Status  On-going        OT Long Term Goals - 09/01/17 0914      OT LONG TERM GOAL #1   Title  Pt will be mod I with upgraded HEP for proximal strength and reach - 11/08/2017    Status  On-going      OT LONG TERM GOAL #2   Title  Pt will demonstrate ability to reach into overhead cabinet in kitchen with RUE to obtain dishes or food with no more than 2/10 pain    Status  On-going      OT LONG TERM GOAL #3   Title  Pt will demonstrate improved grip strength by at least 7 pounds R hand to assist with home mgmt tasks (baseline = 40)    Status  On-going      OT LONG TERM GOAL #4   Title  Pt will demonstrate ability to get change out of pocket using R hand    Status  On-going      OT LONG TERM GOAL #5   Title  Pt will demonstrate improved postural aligment with functional  ambulation to decrease pain in R shoulder and improve ability to carry at least a 2 pound object when walking.    Status  On-going            Plan - 09/01/17 0914    Clinical Impression Statement  Pt continues to progress toward goals. Pt shared that she went to the gym this weekend and used weights for RUE and then had shoulder pain. Again stressed importance of only doing HEP that is given for now for RUE. Pt verbalized understanding.     Occupational Profile and client history currently impacting functional performance  L MCA CVA 2011, HTN, thyroid disease, HLD, chronic shoulder pain, recent episode of increased R weakness    Occupational performance deficits (Please refer to evaluation for details):  ADL's;IADL's;Rest and Sleep;Leisure;Social Participation    Rehab Potential  Good    Current Impairments/barriers affecting progress:  chronic nature of L shoulder pain, length of time since inital onset of stroke    OT Frequency  2x / week    OT Duration  12 weeks    OT Treatment/Interventions  Self-care/ADL training;Aquatic Therapy;Cryotherapy;Moist Heat;Electrical Stimulation;DME and/or AE instruction;Neuromuscular education;Therapeutic exercise;Functional Mobility Training;Manual Therapy;Passive range of motion;Splinting;Therapeutic activities;Balance training;Patient/family education    Plan  add to HEP as possible for grip strength/coordination, address R shoulder pain, manual therapy for alignment, NMR for RUE/trunk, balance, bed positioning for R shoulder pain, grip strength    Consulted and Agree with Plan of Care  Patient;Other (Comment) boyfriend       Patient will benefit from skilled therapeutic intervention in order to improve the following deficits and impairments:  Abnormal gait, Decreased activity tolerance, Decreased balance, Decreased coordination, Decreased knowledge of use of DME, Decreased mobility, Decreased range of motion, Difficulty walking, Decreased strength,  Impaired UE functional use, Impaired tone, Pain  Visit Diagnosis: Other disturbances of skin sensation  Unsteadiness on feet  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 Abdominal pain   . Weakness   . CVA (cerebral vascular accident) (HCC) 07/25/2017  . Right sided weakness 07/25/2017  . Hypokalemia 07/25/2017  . Neck pain on right side 07/25/2017  . Right-sided face pain 07/25/2017  . RUQ abdominal pain 07/25/2017  . Hypertension   . HLD (hyperlipidemia)   . Hypothyroidism     Norton Pastel, OTR/L 09/01/2017, 9:21 AM  [facility] [address] [place], [place], [address] Phone: [phone]   Fax:  [phone]  Name: Shruti Arrey MRN: [id number] Date of Birth: 1956/02/16

## 2017-09-02 ENCOUNTER — Ambulatory Visit: Payer: Medicare HMO | Admitting: Occupational Therapy

## 2017-09-02 ENCOUNTER — Encounter: Payer: Medicare HMO | Admitting: Occupational Therapy

## 2017-09-02 ENCOUNTER — Encounter: Payer: Self-pay | Admitting: Occupational Therapy

## 2017-09-02 DIAGNOSIS — M6281 Muscle weakness (generalized): Secondary | ICD-10-CM

## 2017-09-02 DIAGNOSIS — M25611 Stiffness of right shoulder, not elsewhere classified: Secondary | ICD-10-CM

## 2017-09-02 DIAGNOSIS — M25511 Pain in right shoulder: Secondary | ICD-10-CM

## 2017-09-02 DIAGNOSIS — I69353 Hemiplegia and hemiparesis following cerebral infarction affecting right non-dominant side: Secondary | ICD-10-CM

## 2017-09-02 DIAGNOSIS — R2681 Unsteadiness on feet: Secondary | ICD-10-CM

## 2017-09-02 DIAGNOSIS — G8929 Other chronic pain: Secondary | ICD-10-CM

## 2017-09-02 DIAGNOSIS — R208 Other disturbances of skin sensation: Secondary | ICD-10-CM | POA: Diagnosis not present

## 2017-09-02 NOTE — Patient Instructions (Signed)
  Coordination Activities  Perform the following activities for 15-20  minutes 1-2 times per day with right hand(s).   Rotate ball in fingertips (clockwise and counter-clockwise).  Toss ball between hands.  Toss ball in air and catch with the same hand.  Flip cards 1 at a time as fast as you can.  Pick up coins and place in container or coin bank.  Make stacks of 5 pennies stacking them one at a time.   Screw together nuts and bolts, then unfasten.  Make sure your right hand does the turning and your left hand does the holding! Use a smaller nut and bolt as big ones will be too easy for you.   1. Grip Strengthening (Resistive Putty)  Red Putty   Squeeze putty using thumb and all fingers. Repeat _15___ times. Do __2__ sessions per day.        Copyright  VHI. All rights reserved.

## 2017-09-02 NOTE — Therapy (Signed)
[facility] [address] [place], [place], [address] Phone: [phone]   Fax:  [phone]  Occupational Therapy Treatment  Patient Details  Name: Kristin Mahoney MRN: [id number] Date of Birth: Sep 06, 1955 Referring Provider: Dennard Schaumann, MD   Encounter Date: 09/02/2017  OT End of Session - 09/02/17 1153    Visit Number  5    Number of Visits  24    Authorization Type  Medicare    Authorization Time Period  90 days    OT Start Time  0846    OT Stop Time  0930    OT Time Calculation (min)  44 min    Activity Tolerance  Patient tolerated treatment well       Past Medical History:  Diagnosis Date  . HLD (hyperlipidemia)   . Hypertension   . Hypothyroidism   . Stroke (HCC)   . Thyroid disease     Past Surgical History:  Procedure Laterality Date  . APPENDECTOMY      There were no vitals filed for this visit.  Subjective Assessment - 09/02/17 0850    Subjective   I don't like this brace but yes my foot does catch when I walk    Patient is accompained by:  Family member boyfriend    Pertinent History  Original stroke in 2011 but pt had recent admission on 07/25/2017-07/26/2017 with increased weakness. Imaging was negative.      Patient Stated Goals  I want the pain to go away so I can move the right arm better - to reach into cabinets in my kitchen without pain.    Currently in Pain?  Yes    Pain Score  2     Pain Location  Shoulder    Pain Orientation  Right    Pain Descriptors / Indicators  Sore    Pain Type  Chronic pain    Pain Onset  More than a month ago    Pain Frequency  Constant    Aggravating Factors   nothing in particular it isn't bad at all    Pain Relieving Factors  cream, tens uint I have at home. positioning                   OT Treatments/Exercises (OP) - 09/02/17 0001      Neurological Re-education Exercises   Other Exercises 1  Pt arrived today with AFO that she  has for RLE.  Pt does not wear R AFO because she feels it makes her knee weaker and it isn't comfortable.  Addressed impact of postural alignment with functional ambulation on RUE - without brace and using cane in L hand, pt with significant gait deviations causing increased spasticity in RUE as well as increased abnormal posturing in RUE.  WIth AFO, pt with improved alignment however foot does not consistently clear the floor with this brace.  Also had pt practice using AFO and using R hand to control cane in order to encourage pt to shift onto RLE and improve midline orientation, both of which result in decreased tone and posturing of the RUE. Will communicate with PT .  Also issued HEP to address grip strength using some resistance but with awareness of not increasing dystonic posturing in hand. Pt able to use red putty with isolated control.  Also issued HEP for coordination for R hand.  Pt able to return demostrate all activities after instruction and practice.  OT Education - 09/02/17 1151    Education provided  Yes    Education Details  HEP for coordination and grip strength    Person(s) Educated  Patient    Methods  Explanation;Demonstration;Handout    Comprehension  Verbalized understanding;Returned demonstration       OT Short Term Goals - 09/02/17 1152      OT SHORT TERM GOAL #1   Title  Pt will be mod I with HEP for RUE ROM/strength, coordination, and grip strength - 09/13/2017    Status  On-going      OT SHORT TERM GOAL #2   Title  Pt will be able to tolerate reaching to 110* of shoulder flexion to pick up light object  with no more than 2/10 pain    Status  Achieved      OT SHORT TERM GOAL #3   Title  Pt will demonstrate improved R grip strength by at least 5 pounds to assist with opening jars when cooking (baseline= 40)    Status  On-going      OT SHORT TERM GOAL #4   Title  Pt will report greater ease in buttoning.     Status  On-going      OT SHORT  TERM GOAL #5   Title  Pt will demonstrate improved postural alignment in standing to decrease R shoulder pain with overhead reaching activities    Status  On-going        OT Long Term Goals - 09/02/17 1152      OT LONG TERM GOAL #1   Title  Pt will be mod I with upgraded HEP for proximal strength and reach - 11/08/2017    Status  On-going      OT LONG TERM GOAL #2   Title  Pt will demonstrate ability to reach into overhead cabinet in kitchen with RUE to obtain dishes or food with no more than 2/10 pain    Status  On-going      OT LONG TERM GOAL #3   Title  Pt will demonstrate improved grip strength by at least 7 pounds R hand to assist with home mgmt tasks (baseline = 40)    Status  On-going      OT LONG TERM GOAL #4   Title  Pt will demonstrate ability to get change out of pocket using R hand    Status  On-going      OT LONG TERM GOAL #5   Title  Pt will demonstrate improved postural aligment with functional ambulation to decrease pain in R shoulder and improve ability to carry at least a 2 pound object when walking.    Status  On-going            Plan - 09/02/17 1152    Clinical Impression Statement  Pt progressing toward goals - pt reports overall less pain in R shoulder and increased use of RUE at home.    Occupational Profile and client history currently impacting functional performance  L MCA CVA 2011, HTN, thyroid disease, HLD, chronic shoulder pain, recent episode of increased R weakness    Occupational performance deficits (Please refer to evaluation for details):  ADL's;IADL's;Rest and Sleep;Leisure;Social Participation    Rehab Potential  Good    Current Impairments/barriers affecting progress:  chronic nature of L shoulder pain, length of time since inital onset of stroke    OT Frequency  2x / week    OT Duration  12 weeks    OT  Treatment/Interventions  Self-care/ADL training;Aquatic Therapy;Cryotherapy;Moist Heat;Electrical Stimulation;DME and/or AE  instruction;Neuromuscular education;Therapeutic exercise;Functional Mobility Training;Manual Therapy;Passive range of motion;Splinting;Therapeutic activities;Balance training;Patient/family education    Plan  add to HEP as possible for grip strength/coordination, address R shoulder pain, manual therapy for alignment, NMR for RUE/trunk, balance, bed positioning for R shoulder pain, grip strength    Consulted and Agree with Plan of Care  Patient;Other (Comment) boyfriend       Patient will benefit from skilled therapeutic intervention in order to improve the following deficits and impairments:  Abnormal gait, Decreased activity tolerance, Decreased balance, Decreased coordination, Decreased knowledge of use of DME, Decreased mobility, Decreased range of motion, Difficulty walking, Decreased strength, Impaired UE functional use, Impaired tone, Pain  Visit Diagnosis: Other disturbances of skin sensation  Unsteadiness on feet  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 Abdominal pain   . Weakness   . CVA (cerebral vascular accident) (HCC) 07/25/2017  . Right sided weakness 07/25/2017  . Hypokalemia 07/25/2017  . Neck pain on right side 07/25/2017  . Right-sided face pain 07/25/2017  . RUQ abdominal pain 07/25/2017  . Hypertension   . HLD (hyperlipidemia)   . Hypothyroidism     Norton Pastel , OTR/L 09/02/2017, 11:54 AM  [facility] [address] [place], [place], [address] Phone: [phone]   Fax:  [phone]  Name: Elner Seifert MRN: [id number] Date of Birth: 10-Aug-1955

## 2017-09-03 ENCOUNTER — Ambulatory Visit: Payer: Medicare HMO | Admitting: Physical Therapy

## 2017-09-06 ENCOUNTER — Ambulatory Visit: Payer: Medicare HMO | Admitting: Physical Therapy

## 2017-09-06 ENCOUNTER — Encounter: Payer: Self-pay | Admitting: Physical Therapy

## 2017-09-06 ENCOUNTER — Encounter: Payer: Self-pay | Admitting: Occupational Therapy

## 2017-09-06 ENCOUNTER — Ambulatory Visit: Payer: Medicare HMO | Admitting: Occupational Therapy

## 2017-09-06 DIAGNOSIS — M25611 Stiffness of right shoulder, not elsewhere classified: Secondary | ICD-10-CM

## 2017-09-06 DIAGNOSIS — R2689 Other abnormalities of gait and mobility: Secondary | ICD-10-CM

## 2017-09-06 DIAGNOSIS — G8929 Other chronic pain: Secondary | ICD-10-CM

## 2017-09-06 DIAGNOSIS — R208 Other disturbances of skin sensation: Secondary | ICD-10-CM

## 2017-09-06 DIAGNOSIS — I69353 Hemiplegia and hemiparesis following cerebral infarction affecting right non-dominant side: Secondary | ICD-10-CM

## 2017-09-06 DIAGNOSIS — R2681 Unsteadiness on feet: Secondary | ICD-10-CM

## 2017-09-06 DIAGNOSIS — M25511 Pain in right shoulder: Secondary | ICD-10-CM

## 2017-09-06 DIAGNOSIS — M6281 Muscle weakness (generalized): Secondary | ICD-10-CM

## 2017-09-06 DIAGNOSIS — M21371 Foot drop, right foot: Secondary | ICD-10-CM

## 2017-09-06 NOTE — Therapy (Signed)
[facility] Rehabilitation [facility] [address] [place], [place], [address] Phone: [phone]   Fax:  [phone]  Occupational Therapy Treatment  Patient Details  Name: Kristin Mahoney MRN: [id number] Date of Birth: 09-Oct-1955 Referring Provider: Dennard Schaumann, MD   Encounter Date: 09/06/2017  OT End of Session - 09/06/17 1302    Visit Number  6    Number of Visits  24    Authorization Type  Medicare    Authorization Time Period  90 days    OT Start Time  0845    OT Stop Time  0929    OT Time Calculation (min)  44 min    Activity Tolerance  Patient tolerated treatment well       Past Medical History:  Diagnosis Date  . HLD (hyperlipidemia)   . Hypertension   . Hypothyroidism   . Stroke (HCC)   . Thyroid disease     Past Surgical History:  Procedure Laterality Date  . APPENDECTOMY      There were no vitals filed for this visit.  Subjective Assessment - 09/06/17 0851    Subjective   I had a stomach bug this weekend so I am not feeling very well  (Pended)     Pertinent History  Original stroke in 2011 but pt had recent admission on 07/25/2017-07/26/2017 with increased weakness. Imaging was negative.    (Pended)     Patient Stated Goals  I want the pain to go away so I can move the right arm better - to reach into cabinets in my kitchen without pain.  (Pended)     Currently in Pain?  Yes  (Pended)     Pain Location  Shoulder  (Pended)     Pain Orientation  Right  (Pended)     Pain Descriptors / Indicators  Sore  (Pended)     Pain Type  Chronic pain  (Pended)     Pain Onset  More than a month ago  (Pended)     Pain Frequency  Constant  (Pended)     Aggravating Factors   nothing in particular it isn't bad  (Pended)                    OT Treatments/Exercises (OP) - 09/06/17 1255      Neurological Re-education Exercises   Other Exercises 1  After manual therapy addressed bilateral overhead reach in closed  chain first in supine and then in sitting with min facilitation at scapula - pt with 0/10 pain. Pt with 2/10 pain at rest when she first arrived.        Modalities   Modalities  Cryotherapy      Cryotherapy   Cryotherapy Location  Shoulder lateral aspect    Type of Cryotherapy  Ice massage      Manual Therapy   Manual Therapy  Joint mobilization;Soft tissue mobilization;Scapular mobilization    Manual therapy comments  joint, soft tissue and scapular mob to address alignment and prior to activation for overhead reach. Pt tolerated well and demonstrated reduced pain and improved alignment and control              OT Education - 09/06/17 1259    Education provided  Yes    Education Details  Ice massage with ice cup for R shoulder - 3 times per day. Pt instructed to use heat prior to exercise and ice after if she wishes to [place] heat.     Person(s)  Educated  Patient    Methods  Explanation;Demonstration    Comprehension  Verbalized understanding;Returned demonstration       OT Short Term Goals - 09/06/17 1300      OT SHORT TERM GOAL #1   Title  Pt will be mod I with HEP for RUE ROM/strength, coordination, and grip strength - 09/13/2017    Status  On-going      OT SHORT TERM GOAL #2   Title  Pt will be able to tolerate reaching to 110* of shoulder flexion to pick up light object  with no more than 2/10 pain    Status  Achieved      OT SHORT TERM GOAL #3   Title  Pt will demonstrate improved R grip strength by at least 5 pounds to assist with opening jars when cooking (baseline= 40)    Status  On-going      OT SHORT TERM GOAL #4   Title  Pt will report greater ease in buttoning.     Status  On-going      OT SHORT TERM GOAL #5   Title  Pt will demonstrate improved postural alignment in standing to decrease R shoulder pain with overhead reaching activities    Status  Achieved        OT Long Term Goals - 09/06/17 1301      OT LONG TERM GOAL #1   Title  Pt will be mod I  with upgraded HEP for proximal strength and reach - 11/08/2017    Status  On-going      OT LONG TERM GOAL #2   Title  Pt will demonstrate ability to reach into overhead cabinet in kitchen with RUE to obtain dishes or food with no more than 2/10 pain    Status  On-going      OT LONG TERM GOAL #3   Title  Pt will demonstrate improved grip strength by at least 7 pounds R hand to assist with home mgmt tasks (baseline = 40)    Status  On-going      OT LONG TERM GOAL #4   Title  Pt will demonstrate ability to get change out of pocket using R hand    Status  On-going      OT LONG TERM GOAL #5   Title  Pt will demonstrate improved postural aligment with functional ambulation to decrease pain in R shoulder and improve ability to carry at least a 2 pound object when walking.    Status  On-going            Plan - 09/06/17 1301    Clinical Impression Statement  Pt continues to progress toward goals and overall is demonstrating less pain in R shoulder    Occupational Profile and client history currently impacting functional performance  L MCA CVA 2011, HTN, thyroid disease, HLD, chronic shoulder pain, recent episode of increased R weakness    Occupational performance deficits (Please refer to evaluation for details):  ADL's;IADL's;Rest and Sleep;Leisure;Social Participation    Rehab Potential  Good    Current Impairments/barriers affecting progress:  chronic nature of L shoulder pain, length of time since inital onset of stroke    OT Frequency  2x / week    OT Duration  12 weeks    OT Treatment/Interventions  Self-care/ADL training;Aquatic Therapy;Cryotherapy;Moist Heat;Electrical Stimulation;DME and/or AE instruction;Neuromuscular education;Therapeutic exercise;Functional Mobility Training;Manual Therapy;Passive range of motion;Splinting;Therapeutic activities;Balance training;Patient/family education    Plan  add to HEP as possible for grip  strength/coordination, address R shoulder pain, manual  therapy for alignment, NMR for RUE/trunk, balance, bed positioning for R shoulder pain, grip strength    Consulted and Agree with Plan of Care  Patient       Patient will benefit from skilled therapeutic intervention in order to improve the following deficits and impairments:  Abnormal gait, Decreased activity tolerance, Decreased balance, Decreased coordination, Decreased knowledge of use of DME, Decreased mobility, Decreased range of motion, Difficulty walking, Decreased strength, Impaired UE functional use, Impaired tone, Pain  Visit Diagnosis: Other disturbances of skin sensation  Unsteadiness on feet  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 Abdominal pain   . Weakness   . CVA (cerebral vascular accident) (HCC) 07/25/2017  . Right sided weakness 07/25/2017  . Hypokalemia 07/25/2017  . Neck pain on right side 07/25/2017  . Right-sided face pain 07/25/2017  . RUQ abdominal pain 07/25/2017  . Hypertension   . HLD (hyperlipidemia)   . Hypothyroidism     Norton Pastel, OTR/L 09/06/2017, 1:09 PM  [facility] [address] [place], [place], [address] Phone: [phone]   Fax:  [phone]  Name: Kristin Mahoney MRN: [id number] Date of Birth: 1955-09-04

## 2017-09-06 NOTE — Therapy (Signed)
[facility] [address] [place], [place], [address] Phone: [phone]   Fax:  [phone]  Physical Therapy Treatment  Patient Details  Name: Kristin Mahoney MRN: [id number] Date of Birth: 12-28-1955 Referring Provider: Dennard Schaumann, MD   Encounter Date: 09/06/2017  PT End of Session - 09/06/17 1222    Visit Number  15    Number of Visits  30    Date for PT Re-Evaluation  10/30/17    Authorization Type  [employer] HMO    PT Start Time  0803    PT Stop Time  0848    PT Time Calculation (min)  45 min    Activity Tolerance  Patient tolerated treatment well    Behavior During Therapy  [facility] for tasks assessed/performed       Past Medical History:  Diagnosis Date  . HLD (hyperlipidemia)   . Hypertension   . Hypothyroidism   . Stroke (HCC)   . Thyroid disease     Past Surgical History:  Procedure Laterality Date  . APPENDECTOMY      There were no vitals filed for this visit.  Subjective Assessment - 09/06/17 0817    Subjective  Did not wear the right shoes to try the AFO; will bring different shoes next time.  Had a stomach bug yesterday; feeling better today - just a little weak, dehydrated.  Tried walking with cane in RUE at home - felt off balance.    Patient is accompained by:  Family member    Pertinent History  HTN, CVA, thyroid disease    Limitations  Walking    Patient Stated Goals  Use the Bioness for her RLE and stop using the cane.    Currently in Pain?  Yes    Pain Score  2     Pain Location  Shoulder    Pain Orientation  Right    Pain Descriptors / Indicators  Sore    Pain Type  Chronic pain                       [facility] Adult PT Treatment/Exercise - 09/06/17 1324      Ambulation/Gait   Ambulation/Gait  Yes    Ambulation/Gait Assistance  4: Min guard    Ambulation/Gait Assistance Details  With Bioness in gait mode performed gait initially with cane switched to R  hand to facilitate RUE extension and trunk lean to R; pt demonstrated increased imbalance and difficulty coordinating stepping pattern with cane in RUE; switched to cane in each UE and performed gait with 2 point stepping pattern to continued to facilitate reciprocal UE swing and extension through each UE for trunk control in midline and weight shifting.     Ambulation Distance (Feet)  300 Feet    Assistive device  Straight cane    Ambulation Surface  Level;Indoor      Modalities   Modalities  Geologist, engineering Location  R anterior tibialis and R hamstring muscles    Electrical Stimulation Action  open and closed chain ankle DF, knee flexion, hip extension    Electrical Stimulation Parameters  see saved/adjusted parameters in Tablet 1, steering electrodes    Electrical Stimulation Goals  Strength;Neuromuscular facilitation          Balance Exercises - 09/06/17 0833      Balance Exercises: Standing   Tandem Gait  Forward;Retro;Upper extremity support;Other reps (comment)  6 reps with Bioness in training mode    Other Standing Exercises  Use of Bioness in training mode 12-15 seconds on performed 6 reps R and L step overs/braiding with UE support on counter with therapist assisting with weight shift and trunk rotation      OTAGO PROGRAM   Heel Walking  Support Bioness in training mode x 6 reps        PT Education - 09/06/17 1222    Education provided  Yes    Education Details  HEP with use of BIONESS    Person(s) Educated  Patient    Methods  Explanation    Comprehension  Verbalized understanding;Returned demonstration       PT Short Term Goals - 08/30/17 1010      PT SHORT TERM GOAL #1   Title  Pt will perform DGI assessment without use of Cane but with Bioness donned    Baseline  DGI performed with cane, no Bioness    Time  4    Period  Weeks    Status  Revised    Target Date  09/30/17      PT SHORT TERM GOAL #2    Title  Pt will demonstrate increased gait speed to >/= 2.0 ft/sec with cane and Bioness; 1.8 ft/sec without cane (but with Bioness)    Time  4    Period  Weeks    Status  Revised    Target Date  09/30/17      PT SHORT TERM GOAL #3   Title  Pt will ambulate 230' over indoor surfaces and will negotiate 4 stairs with one rail, alternating sequence with supervision, no cane but with Bioness    Time  4    Period  Weeks    Status  Revised    Target Date  09/30/17      PT SHORT TERM GOAL #4   Title  Pt will improve BERG to >50/56    Baseline  45/56    Time  4    Period  Weeks    Status  Revised    Target Date  09/30/17        PT Long Term Goals - 08/30/17 1118      PT LONG TERM GOAL #1   Title  Pt will demonstrate independence with LE strengthening and balance HEP    Time  8    Period  Weeks    Status  On-going    Target Date  10/30/17      PT LONG TERM GOAL #2   Title  If approved for home Bioness unit; pt will demonstrate independence with donning, care, skin check and wear schedule    Time  8    Period  Weeks    Status  On-going    Target Date  10/30/17      PT LONG TERM GOAL #3   Title  Pt will improve gait velocity to >/= 2.0 ft/sec with Bioness but no cane to improve safety as community ambulator    Time  8    Period  Weeks    Status  Revised    Target Date  10/30/17      PT LONG TERM GOAL #4   Title  Pt will improve DGI by 4 points (with Bioness, no Cane) to decrease falls risk    Baseline  TBD without Cane    Time  8    Period  Weeks    Status  Revised    Target Date  10/30/17      PT LONG TERM GOAL #5   Title  Pt will improve BERG score to >/ = 53/56 to decrease falls risk    Time  8    Period  Weeks    Status  Revised    Target Date  10/30/17      PT LONG TERM GOAL #6   Title  WITH BIONESS donned:  ambulate >230' over indoor surfaces without cane; 200' over outdoor paved surfaces (with cane); negotiate 4 stairs with one rail, alternating sequence,  NO cane at MOD I with improved RLE mechanics    Time  8    Period  Weeks    Status  Revised    Target Date  10/30/17            Plan - 09/06/17 1222    Clinical Impression Statement  Added use of Bioness in training mode to standing balance HEP and continued to focus on lateral hip strength and weight shifting.  Also continued to perform gait training with Bioness but with bilat canes today to focus more on UE swing and trunk control.  Pt demonstrated increased fatigue today, likely due to stomach illness this weekend.  Will continue to address and progress towards LTG.    Rehab Potential  Good    PT Frequency  2x / week    PT Duration  8 weeks    PT Treatment/Interventions  ADLs/Self Care Home Management;Electrical Stimulation;DME Instruction;Gait training;Stair training;Functional mobility training;Therapeutic activities;Therapeutic exercise;Balance training;Neuromuscular re-education;Patient/family education;Passive range of motion    PT Next Visit Plan  HEP with Bioness; elliptical/step ups/gait without cane with Bioness, stairs with Bioness, Treadmill with Bioness.  tandem gait and step overs for proximal hip and balance trianing.  sidelying hip ABD training.    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 Abdominal pain   . Weakness   . CVA (cerebral vascular  accident) (HCC) 07/25/2017  . Right sided weakness 07/25/2017  . Hypokalemia 07/25/2017  . Neck pain on right side 07/25/2017  . Right-sided face pain 07/25/2017  . RUQ abdominal pain 07/25/2017  . Hypertension   . HLD (hyperlipidemia)   . Hypothyroidism    Dierdre Highman, PT, DPT 09/06/17    12:26 PM    [facility] [address] [place], [place], [address] Phone: [phone]   Fax:  [phone]  Name: Olanna Percifield MRN: [id number] Date of Birth: 1955-06-12

## 2017-09-09 ENCOUNTER — Ambulatory Visit: Payer: Medicare HMO | Admitting: Physical Therapy

## 2017-09-09 ENCOUNTER — Ambulatory Visit: Payer: Medicare HMO | Admitting: Occupational Therapy

## 2017-09-10 ENCOUNTER — Ambulatory Visit: Payer: Medicare HMO | Admitting: Physical Therapy

## 2017-09-13 ENCOUNTER — Ambulatory Visit: Payer: Medicare HMO | Admitting: Occupational Therapy

## 2017-09-16 ENCOUNTER — Ambulatory Visit: Payer: Medicare HMO | Admitting: Occupational Therapy

## 2017-09-20 ENCOUNTER — Ambulatory Visit: Payer: Medicare HMO | Admitting: Occupational Therapy

## 2017-09-20 ENCOUNTER — Encounter: Payer: Self-pay | Admitting: Physical Therapy

## 2017-09-20 ENCOUNTER — Ambulatory Visit: Payer: Medicare HMO | Admitting: Physical Therapy

## 2017-09-20 ENCOUNTER — Encounter: Payer: Self-pay | Admitting: Occupational Therapy

## 2017-09-20 DIAGNOSIS — M6281 Muscle weakness (generalized): Secondary | ICD-10-CM

## 2017-09-20 DIAGNOSIS — R208 Other disturbances of skin sensation: Principal | ICD-10-CM

## 2017-09-20 DIAGNOSIS — G8929 Other chronic pain: Secondary | ICD-10-CM

## 2017-09-20 DIAGNOSIS — M21371 Foot drop, right foot: Secondary | ICD-10-CM

## 2017-09-20 DIAGNOSIS — R2681 Unsteadiness on feet: Secondary | ICD-10-CM

## 2017-09-20 DIAGNOSIS — M25611 Stiffness of right shoulder, not elsewhere classified: Secondary | ICD-10-CM

## 2017-09-20 DIAGNOSIS — R2689 Other abnormalities of gait and mobility: Secondary | ICD-10-CM

## 2017-09-20 DIAGNOSIS — I69353 Hemiplegia and hemiparesis following cerebral infarction affecting right non-dominant side: Secondary | ICD-10-CM

## 2017-09-20 DIAGNOSIS — M25511 Pain in right shoulder: Secondary | ICD-10-CM

## 2017-09-20 NOTE — Patient Instructions (Addendum)
Add this stretch to your home exercise program for your right arm:  Do 10 in the morning and 10 at night.  You can also do these during the day if your feels tight.   1. Face the wall and make sure your weight is evenly on your feet. Place both hands flat on the wall.  Slowly slide your hands up the wall as far as you can as long as you don't have pain (elbows should be straight when you reach the top).  A stretching sensation is ok but there should be no joint pain. HOLD FOR A SLOW COUNT OF 5 then slide your hands back down.  Make sure you don't hike your shoulders - reach with your hands not your shoulders.  Make sure you continue to do the ball exercises as well.   Focus on using the putty.  20 LONG HARD squeezes right after breakfast and 20 more right after dinner.  You are doing great! You have made great progress with your arm and reaching. Now we will focus more on strengthening both your arm and your hand.

## 2017-09-20 NOTE — Therapy (Signed)
[facility] [address] [place], [place], [address] Phone: [phone]   Fax:  [phone]  Physical Therapy Treatment  Patient Details  Name: Kristin Mahoney MRN: [id number] Date of Birth: 1955/11/28 Referring Provider: Dennard Schaumann, MD   Encounter Date: 09/20/2017  PT End of Session - 09/20/17 1420    Visit Number  16    Number of Visits  30    Date for PT Re-Evaluation  10/30/17    Authorization Type  [employer] HMO    PT Start Time  [phone]    PT Stop Time  1019    PT Time Calculation (min)  41 min    Activity Tolerance  Patient tolerated treatment well    Behavior During Therapy  [facility] for tasks assessed/performed       Past Medical History:  Diagnosis Date  . HLD (hyperlipidemia)   . Hypertension   . Hypothyroidism   . Stroke (HCC)   . Thyroid disease     Past Surgical History:  Procedure Laterality Date  . APPENDECTOMY      There were no vitals filed for this visit.  Subjective Assessment - 09/20/17 0949    Subjective  Returns after having to cancel and miss multiple sessions due to respiratory illness and car malfunctions.  Still having symptoms of respiratory illness today but feeling better.    Patient is accompained by:  Family member    Pertinent History  HTN, CVA, thyroid disease    Limitations  Walking    Patient Stated Goals  Use the Bioness for her RLE and stop using the cane.    Currently in Pain?  No/denies                       [facility] Adult PT Treatment/Exercise - 09/20/17 0950      Ambulation/Gait   Ambulation/Gait  Yes    Ambulation/Gait Assistance  5: Supervision    Ambulation/Gait Assistance Details  Performed gait around gym with the following orthotics to assess gait mechanics, effectiveness with DF assistance and increase in RUE tone: with patient's own blue rocker, blue rocker with heel wedge, trial Large foot up brace, and [place] PLS.  Pt did not  demonstrate improved foot clearance with blue rocker without or with heel wedge and increased RLE recurvatum.  Improved foot clearance with foot up brace and PLS.  Pt states she may have foot up brace at home.  Due to pt having current AFO for < 5 years pt would not qualify for insurance coverage of new AFO for another 2 years.    Ambulation Distance (Feet)  450 Feet    Assistive device  Straight cane    Gait Pattern  Step-through pattern;Right steppage;Right genu recurvatum;Poor foot clearance - right    Ambulation Surface  Level;Indoor      Therapeutic Activites    Therapeutic Activities  Other Therapeutic Activities    Other Therapeutic Activities  Discussed options for new AFO and improved DF during gait to decrease tone in RUE and improve safety and efficiency of gait in community.  Pt's insurance did not approve Bioness for home.  Pt's options would be to purchase foot up brace (if she does not already have one at home) or self pay for posterior leaf spring             PT Education - 09/20/17 1420    Education provided  Yes    Education Details  orthotic  options    Person(s) Educated  Patient    Methods  Explanation    Comprehension  Verbalized understanding       PT Short Term Goals - 08/30/17 1010      PT SHORT TERM GOAL #1   Title  Pt will perform DGI assessment without use of Cane but with Bioness donned    Baseline  DGI performed with cane, no Bioness    Time  4    Period  Weeks    Status  Revised    Target Date  09/30/17      PT SHORT TERM GOAL #2   Title  Pt will demonstrate increased gait speed to >/= 2.0 ft/sec with cane and Bioness; 1.8 ft/sec without cane (but with Bioness)    Time  4    Period  Weeks    Status  Revised    Target Date  09/30/17      PT SHORT TERM GOAL #3   Title  Pt will ambulate 230' over indoor surfaces and will negotiate 4 stairs with one rail, alternating sequence with supervision, no cane but with Bioness    Time  4    Period   Weeks    Status  Revised    Target Date  09/30/17      PT SHORT TERM GOAL #4   Title  Pt will improve BERG to >50/56    Baseline  45/56    Time  4    Period  Weeks    Status  Revised    Target Date  09/30/17        PT Long Term Goals - 08/30/17 1118      PT LONG TERM GOAL #1   Title  Pt will demonstrate independence with LE strengthening and balance HEP    Time  8    Period  Weeks    Status  On-going    Target Date  10/30/17      PT LONG TERM GOAL #2   Title  If approved for home Bioness unit; pt will demonstrate independence with donning, care, skin check and wear schedule    Time  8    Period  Weeks    Status  On-going    Target Date  10/30/17      PT LONG TERM GOAL #3   Title  Pt will improve gait velocity to >/= 2.0 ft/sec with Bioness but no cane to improve safety as community ambulator    Time  8    Period  Weeks    Status  Revised    Target Date  10/30/17      PT LONG TERM GOAL #4   Title  Pt will improve DGI by 4 points (with Bioness, no Cane) to decrease falls risk    Baseline  TBD without Cane    Time  8    Period  Weeks    Status  Revised    Target Date  10/30/17      PT LONG TERM GOAL #5   Title  Pt will improve BERG score to >/ = 53/56 to decrease falls risk    Time  8    Period  Weeks    Status  Revised    Target Date  10/30/17      PT LONG TERM GOAL #6   Title  WITH BIONESS donned:  ambulate >230' over indoor surfaces without cane; 200' over outdoor paved surfaces (with cane); negotiate 4 stairs  with one rail, alternating sequence, NO cane at MOD I with improved RLE mechanics    Time  8    Period  Weeks    Status  Revised    Target Date  10/30/17            Plan - 09/20/17 1421    Clinical Impression Statement  Performed gait assessment with various orthotic options to determine most effective orthosis for DF assistance, knee control and safe/efficient gait.  Pt's current AFO is no longer appropriate and does not provide sufficient DF  assist and increases genu recurvatum even with heel wedge.  Pt's insurance would not approve Bioness and will not approve a new orthosis x 2 more years.  Pt did demonstrate improved foot clearance with foot up brace and may be willing to purchase if she does not already have one at home.  Will continue to address and will continue to utilize Bioness for RLE NMR to progress towards LTG.    Rehab Potential  Good    PT Frequency  2x / week    PT Duration  8 weeks    PT Treatment/Interventions  ADLs/Self Care Home Management;Electrical Stimulation;DME Instruction;Gait training;Stair training;Functional mobility training;Therapeutic activities;Therapeutic exercise;Balance training;Neuromuscular re-education;Patient/family education;Passive range of motion    PT Next Visit Plan  CHECK STG.  Did pt have foot up brace at home?  Provide with purchasing information if not.  Gait with foot up without cane.  HEP with Bioness; elliptical/step ups/gait without cane with Bioness, stairs with Bioness, Treadmill with Bioness.  tandem gait and step overs for proximal hip and balance trianing.  sidelying hip ABD training.    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 Abdominal pain   . Weakness   . CVA (cerebral vascular accident) (HCC) 07/25/2017  . Right sided weakness 07/25/2017  .  Hypokalemia 07/25/2017  . Neck pain on right side 07/25/2017  . Right-sided face pain 07/25/2017  . RUQ abdominal pain 07/25/2017  . Hypertension   . HLD (hyperlipidemia)   . Hypothyroidism     Dierdre Highman, PT, DPT 09/20/17    2:29 PM    [facility] [address] [place], [place], [address] Phone: [phone]   Fax:  [phone]  Name: Kristin Mahoney MRN: [id number] Date of Birth: 24-May-1955

## 2017-09-20 NOTE — Therapy (Signed)
[facility] [address] [place], [place], [address] Phone: [phone]   Fax:  [phone]  Occupational Therapy Treatment  Patient Details  Name: Kristin Mahoney MRN: [id number] Date of Birth: 06/27/1955 Referring Provider: Dennard Schaumann, MD   Encounter Date: 09/20/2017  OT End of Session - 09/20/17 0921    Visit Number  7    Number of Visits  24    Date for OT Re-Evaluation  11/08/17    Authorization Type  Medicare    Authorization Time Period  90 days    OT Start Time  0802    OT Stop Time  0845    OT Time Calculation (min)  43 min    Activity Tolerance  Patient tolerated treatment well       Past Medical History:  Diagnosis Date  . HLD (hyperlipidemia)   . Hypertension   . Hypothyroidism   . Stroke (HCC)   . Thyroid disease     Past Surgical History:  Procedure Laterality Date  . APPENDECTOMY      There were no vitals filed for this visit.  Subjective Assessment - 09/20/17 0805    Subjective   I have bronchitis    Patient is accompained by:  Family member significant other    Pertinent History  Original stroke in 2011 but pt had recent admission on 07/25/2017-07/26/2017 with increased weakness. Imaging was negative.      Patient Stated Goals  I want the pain to go away so I can move the right arm better - to reach into cabinets in my kitchen without pain.    Currently in Pain?  No/denies                   OT Treatments/Exercises (OP) - 09/20/17 6962      Exercises   Exercises  Hand      Hand Exercises   Hand Gripper with Small Beads  gripper on #2 to pick up one inch blocks - pt with moderate difficulty and required multiple rest breaks. Performance improved with practice.      Other Hand Exercises  Pt has not made gains with grip strength - reviewed how she was using theraputty and instructed pt to do long hard squeezes (vs small quick ones).      Other Hand Exercises  15  pegs in red putty - pt with minimal difficulty.        Neurological Re-education Exercises   Other Exercises 1  Neuro re ed to address overhead reach in closed chain with ball in seated position as well as wall slides on wall (slides added to HEP - see pt instruction section for details). Progresesd to open chain unilateral functional reach to obtain light weight object in kitchen. Pt now has 145* of shoulder flexion without pain.  Will focus on building proximal strength at this time.              OT Education - 09/20/17 0841    Education provided  Yes    Education Details  upgraded HEP for RUE    Person(s) Educated  Patient    Methods  Explanation;Demonstration    Comprehension  Verbalized understanding;Returned demonstration       OT Short Term Goals - 09/20/17 0916      OT SHORT TERM GOAL #1   Title  Pt will be mod I with HEP for RUE ROM/strength, coordination, and grip strength - 09/13/2017    Status  Achieved      OT SHORT TERM GOAL #2   Title  Pt will be able to tolerate reaching to 110* of shoulder flexion to pick up light object  with no more than 2/10 pain    Status  Achieved      OT SHORT TERM GOAL #3   Title  Pt will demonstrate improved R grip strength by at least 5 pounds to assist with opening jars when cooking (baseline= 40)    Status  On-going      OT SHORT TERM GOAL #4   Title  Pt will report greater ease in buttoning.     Status  Achieved      OT SHORT TERM GOAL #5   Title  Pt will demonstrate improved postural alignment in standing to decrease R shoulder pain with overhead reaching activities    Status  Achieved        OT Long Term Goals - 09/20/17 0917      OT LONG TERM GOAL #1   Title  Pt will be mod I with upgraded HEP for proximal strength and reach - 11/08/2017    Status  On-going      OT LONG TERM GOAL #2   Title  Pt will demonstrate ability to reach into overhead cabinet in kitchen with RUE to obtain dishes or food with no more than 2/10  pain    Status  Achieved 09/20/2016  145* of shoulder flexion with functional reach with no pain      OT LONG TERM GOAL #3   Title  Pt will demonstrate improved grip strength by at least 7 pounds R hand to assist with home mgmt tasks (baseline = 40)    Status  On-going      OT LONG TERM GOAL #4   Title  Pt will demonstrate ability to get change out of pocket using R hand    Status  On-going      OT LONG TERM GOAL #5   Title  Pt will demonstrate improved postural aligment with functional ambulation to decrease pain in R shoulder and improve ability to carry at least a 2 pound object when walking.    Status  On-going            Plan - 09/20/17 4696    Clinical Impression Statement  Pt has made great gains with overhea reach and reduction of pain in R shoulder.  Pt has not made gains in grip strength.  Reviewed HEP and will focus on proximal RUE strength and grip strength.     Occupational Profile and client history currently impacting functional performance  L MCA CVA 2011, HTN, thyroid disease, HLD, chronic shoulder pain, recent episode of increased R weakness    Occupational performance deficits (Please refer to evaluation for details):  ADL's;IADL's;Rest and Sleep;Leisure;Social Participation    Rehab Potential  Good    Current Impairments/barriers affecting progress:  chronic nature of L shoulder pain, length of time since inital onset of stroke    OT Frequency  2x / week    OT Duration  12 weeks    OT Treatment/Interventions  Self-care/ADL training;Aquatic Therapy;Cryotherapy;Moist Heat;Electrical Stimulation;DME and/or AE instruction;Neuromuscular education;Therapeutic exercise;Functional Mobility Training;Manual Therapy;Passive range of motion;Splinting;Therapeutic activities;Balance training;Patient/family education    Plan  monitor R shoulder pain, address increasing proximal strength while maintaining alignment, functional overhead reach, NMR for RUE/trunk and grip strength,  functional use of RUE    Consulted and Agree with Plan of Care  Patient  Patient will benefit from skilled therapeutic intervention in order to improve the following deficits and impairments:  Abnormal gait, Decreased activity tolerance, Decreased balance, Decreased coordination, Decreased knowledge of use of DME, Decreased mobility, Decreased range of motion, Difficulty walking, Decreased strength, Impaired UE functional use, Impaired tone, Pain  Visit Diagnosis: Other disturbances of skin sensation  Unsteadiness on feet  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 Abdominal pain   . Weakness   . CVA (cerebral vascular accident) (HCC) 07/25/2017  . Right sided weakness 07/25/2017  . Hypokalemia 07/25/2017  . Neck pain on right side 07/25/2017  . Right-sided face pain 07/25/2017  . RUQ abdominal pain 07/25/2017  . Hypertension   . HLD (hyperlipidemia)   . Hypothyroidism     Norton Pastel, OTR/L 09/20/2017, 9:22 AM  [facility] [address] [place], [place], [address] Phone: [phone]   Fax:  [phone]  Name: Anedra Penafiel MRN: [id number] Date of Birth: 1956/03/15

## 2017-09-22 ENCOUNTER — Ambulatory Visit: Payer: Medicare HMO | Admitting: Occupational Therapy

## 2017-09-22 ENCOUNTER — Ambulatory Visit: Payer: Medicare HMO | Admitting: Physical Therapy

## 2017-09-28 ENCOUNTER — Ambulatory Visit: Payer: Medicare HMO | Admitting: Occupational Therapy

## 2017-09-30 ENCOUNTER — Encounter: Payer: Self-pay | Admitting: Physical Therapy

## 2017-09-30 ENCOUNTER — Ambulatory Visit: Payer: Medicare HMO | Admitting: Physical Therapy

## 2017-09-30 ENCOUNTER — Encounter: Payer: Self-pay | Admitting: Occupational Therapy

## 2017-09-30 ENCOUNTER — Ambulatory Visit: Payer: Medicare HMO | Admitting: Occupational Therapy

## 2017-09-30 DIAGNOSIS — I69353 Hemiplegia and hemiparesis following cerebral infarction affecting right non-dominant side: Secondary | ICD-10-CM

## 2017-09-30 DIAGNOSIS — R208 Other disturbances of skin sensation: Principal | ICD-10-CM

## 2017-09-30 DIAGNOSIS — R2681 Unsteadiness on feet: Secondary | ICD-10-CM

## 2017-09-30 DIAGNOSIS — M6281 Muscle weakness (generalized): Secondary | ICD-10-CM

## 2017-09-30 DIAGNOSIS — R2689 Other abnormalities of gait and mobility: Secondary | ICD-10-CM

## 2017-09-30 DIAGNOSIS — M25611 Stiffness of right shoulder, not elsewhere classified: Secondary | ICD-10-CM

## 2017-09-30 DIAGNOSIS — M21371 Foot drop, right foot: Secondary | ICD-10-CM

## 2017-09-30 DIAGNOSIS — G8929 Other chronic pain: Secondary | ICD-10-CM

## 2017-09-30 DIAGNOSIS — M25511 Pain in right shoulder: Secondary | ICD-10-CM

## 2017-09-30 NOTE — Therapy (Signed)
[facility] [address] [place], [place], [address] Phone: [phone]   Fax:  [phone]  Occupational Therapy Treatment  Patient Details  Name: Kristin Mahoney MRN: [id number] Date of Birth: Dec 07, 1955 Referring Provider: Dennard Schaumann, MD   Encounter Date: 09/30/2017  OT End of Session - 09/30/17 0859    Visit Number  8    Number of Visits  24    Date for OT Re-Evaluation  11/08/17    Authorization Type  Medicare    Authorization Time Period  90 days    OT Start Time  0801    OT Stop Time  0845    OT Time Calculation (min)  44 min    Activity Tolerance  Patient tolerated treatment well       Past Medical History:  Diagnosis Date  . HLD (hyperlipidemia)   . Hypertension   . Hypothyroidism   . Stroke (HCC)   . Thyroid disease     Past Surgical History:  Procedure Laterality Date  . APPENDECTOMY      There were no vitals filed for this visit.  Subjective Assessment - 09/30/17 0806    Subjective   I went to the gym and used a machine and now my shoulder hurts again - I know you said not to do that    Patient is accompained by:  Family member    Pertinent History  Original stroke in 2011 but pt had recent admission on 07/25/2017-07/26/2017 with increased weakness. Imaging was negative.      Patient Stated Goals  I want the pain to go away so I can move the right arm better - to reach into cabinets in my kitchen without pain.    Currently in Pain?  Yes    Pain Score  4     Pain Location  Shoulder    Pain Orientation  Right    Pain Descriptors / Indicators  Sore    Pain Type  Acute pain    Pain Onset  Yesterday    Pain Frequency  Constant    Aggravating Factors   I worked at at [employer] and used a Systems analyst - I know you told me not to do that    Pain Relieving Factors  muscle cream, I guess I could have iced it but I forgot                   OT Treatments/Exercises (OP) - 09/30/17  0001      Neurological Re-education Exercises   Other Exercises 1  Neuro re ed following manual therapy in supine, sidelying, sitting and standing to address proximal stability and proximal strength using body weight for resistance and to increase proximal activity.  Progressed to closed chain bilateral overhead reach and then to unilateral overhead reach with functional task. By end of session pt reported 0/10 pain with reaching (started session with 4-5/10).        Manual Therapy   Manual Therapy  Soft tissue mobilization;Scapular mobilization    Manual therapy comments  Soft tissue and scap mob as pt used weight machine with resistance for UE's and malalignment and pain returned  Again stressed importance of only doing HEP that pt has been given - discussed that using resistance when shoulder has imbalanced muscle activity and altered tone is not strengthening the muscles but just encouraging malalignment and increased abnormal tone leading to pain. Pt verbalized understanding.  OT Education - 09/30/17 [phone]    Education provided  Yes    Education Details  reinforced avoiding weight machines at gym for arm and staying with HEP     Person(s) Educated  Patient;Other (comment) boyfriend   boyfriend   Methods  Explanation    Comprehension  Verbalized understanding       OT Short Term Goals - 09/30/17 0857      OT SHORT TERM GOAL #1   Title  Pt will be mod I with HEP for RUE ROM/strength, coordination, and grip strength - 09/13/2017    Status  Achieved      OT SHORT TERM GOAL #2   Title  Pt will be able to tolerate reaching to 110* of shoulder flexion to pick up light object  with no more than 2/10 pain    Status  Achieved      OT SHORT TERM GOAL #3   Title  Pt will demonstrate improved R grip strength by at least 5 pounds to assist with opening jars when cooking (baseline= 40)    Status  On-going      OT SHORT TERM GOAL #4   Title  Pt will report greater ease in  buttoning.     Status  Achieved      OT SHORT TERM GOAL #5   Title  Pt will demonstrate improved postural alignment in standing to decrease R shoulder pain with overhead reaching activities    Status  Achieved        OT Long Term Goals - 09/30/17 0857      OT LONG TERM GOAL #1   Title  Pt will be mod I with upgraded HEP for proximal strength and reach - 11/08/2017    Status  On-going      OT LONG TERM GOAL #2   Title  Pt will demonstrate ability to reach into overhead cabinet in kitchen with RUE to obtain dishes or food with no more than 2/10 pain    Status  Achieved 09/20/2016  145* of shoulder flexion with functional reach with no pain      OT LONG TERM GOAL #3   Title  Pt will demonstrate improved grip strength by at least 7 pounds R hand to assist with home mgmt tasks (baseline = 40)    Status  On-going      OT LONG TERM GOAL #4   Title  Pt will demonstrate ability to get change out of pocket using R hand    Status  On-going      OT LONG TERM GOAL #5   Title  Pt will demonstrate improved postural aligment with functional ambulation to decrease pain in R shoulder and improve ability to carry at least a 2 pound object when walking.    Status  On-going            Plan - 09/30/17 0857    Clinical Impression Statement  Pt requires continued education on use of weight machines at gym relative to arm - will continue to reinforce.  Pt progressing toward goals.     Occupational Profile and client history currently impacting functional performance  L MCA CVA 2011, HTN, thyroid disease, HLD, chronic shoulder pain, recent episode of increased R weakness    Occupational performance deficits (Please refer to evaluation for details):  ADL's;IADL's;Rest and Sleep;Leisure;Social Participation    Rehab Potential  Good    Current Impairments/barriers affecting progress:  chronic nature of L shoulder pain, length of  time since inital onset of stroke    OT Frequency  2x / week    OT  Duration  12 weeks    OT Treatment/Interventions  Self-care/ADL training;Aquatic Therapy;Cryotherapy;Moist Heat;Electrical Stimulation;DME and/or AE instruction;Neuromuscular education;Therapeutic exercise;Functional Mobility Training;Manual Therapy;Passive range of motion;Splinting;Therapeutic activities;Balance training;Patient/family education    Plan  check grip strength, add to HEP as possible,  monitor R shoulder pain, address increasing proximal strength while maintaining alignment, functional overhead reach, NMR for RUE/trunk and grip strength, functional use of RUE    Consulted and Agree with Plan of Care  Patient       Patient will benefit from skilled therapeutic intervention in order to improve the following deficits and impairments:  Abnormal gait, Decreased activity tolerance, Decreased balance, Decreased coordination, Decreased knowledge of use of DME, Decreased mobility, Decreased range of motion, Difficulty walking, Decreased strength, Impaired UE functional use, Impaired tone, Pain  Visit Diagnosis: Other disturbances of skin sensation  Unsteadiness on feet  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 Abdominal pain   . Weakness   . CVA (cerebral vascular accident) (HCC) 07/25/2017  . Right sided weakness 07/25/2017  . Hypokalemia 07/25/2017  . Neck pain on right side 07/25/2017  . Right-sided face pain 07/25/2017  . RUQ abdominal pain 07/25/2017  . Hypertension   . HLD (hyperlipidemia)   . Hypothyroidism     Norton Pastel, OTR/L 09/30/2017, 9:01 AM  [facility] [address] [place], [place], [address] Phone: [phone]   Fax:  [phone]  Name: Kristin Mahoney MRN:  [id number] Date of Birth: 1955-12-11

## 2017-09-30 NOTE — Therapy (Signed)
[facility] [address] [place], [place], [address] Phone: [phone]   Fax:  [phone]  Physical Therapy Treatment  Patient Details  Name: Kristin Mahoney MRN: [id number] Date of Birth: 16-Feb-1956 Referring Provider: Lorelei Pont, MD   Encounter Date: 09/30/2017  PT End of Session - 09/30/17 1300    Visit Number  17    Number of Visits  30    Date for PT Re-Evaluation  10/30/17    Authorization Type  [employer] H[employer]    PT Start Time  [phone]    PT Stop Time  1021    PT Time Calculation (min)  45 min    Activity Tolerance  Patient tolerated treatment well    Behavior During Therapy  [facility] for tasks assessed/performed       Past Medical History:  Diagnosis Date  . HLD (hyperlipidemia)   . Hypertension   . Hypothyroidism   . Stroke ([place])   . Thyroid disease     Past Surgical History:  Procedure Laterality Date  . APPENDECTOMY      There were no vitals filed for this visit.  Subjective Assessment - 09/30/17 0941    Subjective  Pt went to ED with R sided weakness; figured out later it was because she had not taken her thyroid medication for a few days.  Respiratory illness has cleared up.    Patient is accompained by:  Family member    Pertinent History  HTN, CVA, thyroid disease    Limitations  Walking    Patient Stated Goals  Use the Bioness for her RLE and stop using the cane.    Currently in Pain?  Yes    Pain Score  2  better after OT    Pain Location  Shoulder    Pain Orientation  Right    Pain Descriptors / Indicators  Sore         [facility] PT Assessment - 09/30/17 0953      Standardized Balance Assessment   Standardized Balance Assessment  Berg Balance Test;Dynamic Gait Index;10 meter walk test    10 Meter Walk  19.41 without cane, no Bioness but with foot up brace on or 1.6 ft/sec.  15.84 seconds or 2.05 ft/sec with cane and foot up       [employer] Test   Sit to Stand  Able to  stand without using hands and stabilize independently    Standing Unsupported  Able to stand safely 2 minutes    Sitting with Back Unsupported but Feet Supported on Floor or Stool  Able to sit safely and securely 2 minutes    Stand to Sit  Sits safely with minimal use of hands    Transfers  Able to transfer safely, minor use of hands    Standing Unsupported with Eyes Closed  Able to stand 10 seconds safely    Standing Ubsupported with Feet Together  Able to place feet together independently and stand 1 minute safely    From Standing, Reach Forward with Outstretched Arm  Can reach confidently >25 cm (10")    From Standing Position, Pick up Object from Floor  Able to pick up shoe safely and easily    From Standing Position, Turn to Look Behind Over each Shoulder  Looks behind from both sides and weight shifts well    Turn 360 Degrees  Able to turn 360 degrees safely one side only in 4 seconds or less    Standing  Unsupported, Alternately Place Feet on Step/Stool  Able to stand independently and safely and complete 8 steps in 20 seconds    Standing Unsupported, One Foot in Front  Able to plae foot ahead of the other independently and hold 30 seconds    Standing on One Leg  Tries to lift leg/unable to hold 3 seconds but remains standing independently    Total Score  51    Berg comment:  51/56 with foot up brace, no Bioness      Dynamic Gait Index   Level Surface  Mild Impairment    Change in Gait Speed  Mild Impairment    Gait with Horizontal Head Turns  Mild Impairment    Gait with Vertical Head Turns  Mild Impairment    Gait and Pivot Turn  Mild Impairment    Step Over Obstacle  Mild Impairment    Step Around Obstacles  Normal    Steps  Mild Impairment    Total Score  17    DGI comment:  17/24                   [place] Adult PT Treatment/Exercise - 09/30/17 1258      Therapeutic Activites    Therapeutic Activities  Other Therapeutic Activities    Other Therapeutic Activities   discussed two types of foot up brace pt can self-purchase and provided pt with purchasing information.  Pt to compare at home and make decision by next week; will have pt to bring it to therapy for set up and training             PT Education - 09/30/17 1258    Education provided  Yes    Education Details  progress with STG; provided purchasing information for two styles of foot up brace     Person(s) Educated  Patient    Methods  Explanation    Comprehension  Verbalized understanding       PT Short Term Goals - 09/30/17 0951      PT SHORT TERM GOAL #1   Title  Pt will perform DGI assessment without use of Cane but with Bioness donned    Baseline  DGI performed without cane and with foot up brace    Time  4    Period  Weeks    Status  Partially Met      PT SHORT TERM GOAL #2   Title  Pt will demonstrate increased gait speed to >/= 2.0 ft/sec with cane and Bioness; 1.8 ft/sec without cane (but with Bioness)    Baseline  1.6 without cane but with foot up brace, with cane and foot up brace, 2.0 ft/sec with cane and foot up     Time  4    Period  Weeks    Status  Achieved      PT SHORT TERM GOAL #3   Title  Pt will ambulate 230' over indoor surfaces and will negotiate 4 stairs with one rail, alternating sequence with supervision, no cane but with Bioness    Time  4    Period  Weeks    Status  On-going      PT SHORT TERM GOAL #4   Title  Pt will improve BERG to >50/56    Baseline  45/56, 51/56 with foot up brace    Time  4    Period  Weeks    Status  Achieved        PT Long Term  Goals - 08/30/17 1118      PT LONG TERM GOAL #1   Title  Pt will demonstrate independence with LE strengthening and balance HEP    Time  8    Period  Weeks    Status  On-going    Target Date  10/30/17      PT LONG TERM GOAL #2   Title  If approved for home Bioness unit; pt will demonstrate independence with donning, care, skin check and wear schedule    Time  8    Period  Weeks     Status  On-going    Target Date  10/30/17      PT LONG TERM GOAL #3   Title  Pt will improve gait velocity to >/= 2.0 ft/sec with Bioness but no cane to improve safety as community ambulator    Time  8    Period  Weeks    Status  Revised    Target Date  10/30/17      PT LONG TERM GOAL #4   Title  Pt will improve DGI by 4 points (with Bioness, no Cane) to decrease falls risk    Baseline  TBD without Cane    Time  8    Period  Weeks    Status  Revised    Target Date  10/30/17      PT LONG TERM GOAL #5   Title  Pt will improve BERG score to >/ = 53/56 to decrease falls risk    Time  8    Period  Weeks    Status  Revised    Target Date  10/30/17      PT LONG TERM GOAL #6   Title  WITH BIONESS donned:  ambulate >230' over indoor surfaces without cane; 200' over outdoor paved surfaces (with cane); negotiate 4 stairs with one rail, alternating sequence, NO cane at MOD I with improved RLE mechanics    Time  8    Period  Weeks    Status  Revised    Target Date  10/30/17            Plan - 09/30/17 1301    Clinical Impression Statement  Performed assessment of progress towards STG; pt has met 2/4 STG with goal #3 still to be assessed at next visit.  Pt was not approved by insurance for home Bioness unit but is willing to purchase foot up brace to assist with DF and foot clearance during community gait so goals performed with foot up brace instead of Bioness.  With cane and foot up brace pt demonstrates significant improvment in gait velocity, standing balance and dynamic gait safety.  Pt's goal is to eventually be able to ambulate safely without cane.  Therapy will continue to address balance and gait impairments with foot up brace to maximize safety and independence and decrease falls risk.    Rehab Potential  Good    PT Frequency  2x / week    PT Duration  8 weeks    PT Treatment/Interventions  ADLs/Self Care Home Management;Electrical Stimulation;DME Instruction;Gait  training;Stair training;Functional mobility training;Therapeutic activities;Therapeutic exercise;Balance training;Neuromuscular re-education;Patient/family education;Passive range of motion    PT Next Visit Plan  CHECK STG #3 with our foot up.  Did she buy foot up?  Gait with foot up without cane.  Strengthening with Bioness    Consulted and Agree with Plan of Care  Patient;Family member/caregiver    Family Member Consulted  significant other  Patient will benefit from skilled therapeutic intervention in order to improve the following deficits and impairments:  Abnormal gait, Decreased balance, Decreased strength, Difficulty walking, Impaired sensation, Pain, Impaired tone  Visit Diagnosis: Hemiplegia and hemiparesis following cerebral infarction affecting right non-dominant side (HCC)  Foot drop, right  Other abnormalities of gait and mobility  Other disturbances of skin sensation  Unsteadiness on feet     Problem List Patient Active Problem List   Diagnosis Date Noted  . Abdominal pain   . Weakness   . CVA (cerebral vascular accident) ([place]) 07/25/2017  . Right sided weakness 07/25/2017  . Hypokalemia 07/25/2017  . Neck pain on right side 07/25/2017  . Right-sided face pain 07/25/2017  . RUQ abdominal pain 07/25/2017  . Hypertension   . HLD (hyperlipidemia)   . Hypothyroidism     Rico Junker, PT, DPT 09/30/17    1:07 PM    [facility] [address] [place], [place], [address] Phone: [phone]   Fax:  [phone]  Name: Kristin Mahoney MRN: [id number] Date of Birth: 02/21/1956

## 2017-10-04 ENCOUNTER — Encounter: Payer: Medicare HMO | Admitting: Occupational Therapy

## 2017-10-05 ENCOUNTER — Ambulatory Visit: Payer: Medicare HMO | Admitting: Physical Therapy

## 2017-10-05 ENCOUNTER — Encounter: Payer: Self-pay | Admitting: Occupational Therapy

## 2017-10-05 ENCOUNTER — Ambulatory Visit: Payer: Medicare HMO | Attending: Internal Medicine | Admitting: Occupational Therapy

## 2017-10-05 ENCOUNTER — Encounter: Payer: Self-pay | Admitting: Physical Therapy

## 2017-10-05 DIAGNOSIS — G8929 Other chronic pain: Secondary | ICD-10-CM | POA: Diagnosis present

## 2017-10-05 DIAGNOSIS — I69353 Hemiplegia and hemiparesis following cerebral infarction affecting right non-dominant side: Secondary | ICD-10-CM

## 2017-10-05 DIAGNOSIS — M25511 Pain in right shoulder: Secondary | ICD-10-CM | POA: Insufficient documentation

## 2017-10-05 DIAGNOSIS — M21371 Foot drop, right foot: Secondary | ICD-10-CM | POA: Insufficient documentation

## 2017-10-05 DIAGNOSIS — M6281 Muscle weakness (generalized): Secondary | ICD-10-CM | POA: Diagnosis present

## 2017-10-05 DIAGNOSIS — R208 Other disturbances of skin sensation: Secondary | ICD-10-CM | POA: Insufficient documentation

## 2017-10-05 DIAGNOSIS — R2681 Unsteadiness on feet: Secondary | ICD-10-CM | POA: Insufficient documentation

## 2017-10-05 DIAGNOSIS — R2689 Other abnormalities of gait and mobility: Secondary | ICD-10-CM

## 2017-10-05 DIAGNOSIS — M25611 Stiffness of right shoulder, not elsewhere classified: Secondary | ICD-10-CM | POA: Diagnosis present

## 2017-10-05 NOTE — Therapy (Signed)
[facility] [address] [place], [place], [address] Phone: [phone]   Fax:  [phone]  Occupational Therapy Treatment  Patient Details  Name: Kristin Mahoney MRN: [id number] Date of Birth: 12/27/1955 Referring Provider: Dennard Schaumann, MD   Encounter Date: 10/05/2017  OT End of Session - 10/05/17 0907    Visit Number  9    Number of Visits  24    Date for OT Re-Evaluation  11/08/17    Authorization Type  Medicare    Authorization Time Period  90 days    OT Start Time  0802    OT Stop Time  0848    OT Time Calculation (min)  46 min       Past Medical History:  Diagnosis Date  . HLD (hyperlipidemia)   . Hypertension   . Hypothyroidism   . Stroke (HCC)   . Thyroid disease     Past Surgical History:  Procedure Laterality Date  . APPENDECTOMY      There were no vitals filed for this visit.  Subjective Assessment - 10/05/17 0805    Subjective   Can I start adding weight to my arm when I go to the gym?    Patient is accompained by:  Family member boyfriend    Pertinent History  Original stroke in 2011 but pt had recent admission on 07/25/2017-07/26/2017 with increased weakness. Imaging was negative.      Currently in Pain?  Yes    Pain Score  1     Pain Location  Shoulder    Pain Orientation  Right    Pain Descriptors / Indicators  Nagging    Pain Type  Chronic pain    Pain Onset  Today    Pain Frequency  Constant    Aggravating Factors   not sure its really minor today    Pain Relieving Factors  muscle cream, my stretches     Multiple Pain Sites  No                   OT Treatments/Exercises (OP) - 10/05/17 0001      ADLs   ADL Comments  Reassessed grip strength - pt now has 52 pounds of grip strength in R hand (baseline = 40 at eval). Pt reports she feels she can use the R hand much more at home for functional tasks and finds herself using it much more spontaneously when doing  functional tasks. See goal section for updates. Also discussed how pt might incorporate weights into activities when she goes to the gym - pt participates in exercise class.  Educated pt on biomechanics of shoulder and why weight causes pain when done against gravity. Pt was able to do 3 pound weight in sitting for bicep curls and 1 pound weight in sitting for shoulder presses however alignment was more impacted. Discussed using weight bench and adding weights in supine and pt open to this suggestion. Will explore potential gym activities next session.       Neurological Re-education Exercises   Other Exercises 1  Neuro re ed in supine, sitting and standing to address postural alignment (pt now has tendency to hike LEFT shoulder when ambulating with cane).  Used toe up to assist with dorsiflexion for RLE which allows pt to come into midline when ambulating and improves postural alignment.  Also addressed pt's ability to carry item in R hand when ambulating - today pt able to now ambulate and carry up  to a 3 pound weight in R hand during functional task.  With improved postural alignment, pt has less discomfort in R shoulder, demonstrates improved balance with functional ambulation and develops normal RUE arm swing with functional ambulation.                 OT Short Term Goals - 10/05/17 0903      OT SHORT TERM GOAL #1   Title  Pt will be mod I with HEP for RUE ROM/strength, coordination, and grip strength - 09/13/2017    Status  Achieved      OT SHORT TERM GOAL #2   Title  Pt will be able to tolerate reaching to 110* of shoulder flexion to pick up light object  with no more than 2/10 pain    Status  Achieved      OT SHORT TERM GOAL #3   Title  Pt will demonstrate improved R grip strength by at least 5 pounds to assist with opening jars when cooking (baseline= 40)    Status  Achieved 10/05/2017  52 pounds      OT SHORT TERM GOAL #4   Title  Pt will report greater ease in buttoning.      Status  Achieved      OT SHORT TERM GOAL #5   Title  Pt will demonstrate improved postural alignment in standing to decrease R shoulder pain with overhead reaching activities    Status  Achieved        OT Long Term Goals - 10/05/17 0903      OT LONG TERM GOAL #1   Title  Pt will be mod I with upgraded HEP for proximal strength and reach - 11/08/2017    Status  On-going      OT LONG TERM GOAL #2   Title  Pt will demonstrate ability to reach into overhead cabinet in kitchen with RUE to obtain dishes or food with no more than 2/10 pain    Status  Achieved 09/20/2016  145* of shoulder flexion with functional reach with no pain      OT LONG TERM GOAL #3   Title  Pt will demonstrate improved grip strength by at least 7 pounds R hand to assist with home mgmt tasks (baseline = 40)    Status  Achieved 10/05/2017  52 pounds      OT LONG TERM GOAL #4   Title  Pt will demonstrate ability to get change out of pocket using R hand    Status  On-going      OT LONG TERM GOAL #5   Title  Pt will demonstrate improved postural aligment with functional ambulation to decrease pain in R shoulder and improve ability to carry at least a 2 pound object when walking.    Status  On-going            Plan - 10/05/17 0906    Clinical Impression Statement  Pt progressing toward goals.  Pt eager to progress home exercise program    Occupational Profile and client history currently impacting functional performance  L MCA CVA 2011, HTN, thyroid disease, HLD, chronic shoulder pain, recent episode of increased R weakness    Occupational performance deficits (Please refer to evaluation for details):  ADL's;IADL's;Rest and Sleep;Leisure;Social Participation    Rehab Potential  Good    Current Impairments/barriers affecting progress:  chronic nature of L shoulder pain, length of time since inital onset of stroke    OT Frequency  2x / week    OT Duration  12 weeks    OT Treatment/Interventions  Self-care/ADL  training;Aquatic Therapy;Cryotherapy;Moist Heat;Electrical Stimulation;DME and/or AE instruction;Neuromuscular education;Therapeutic exercise;Functional Mobility Training;Manual Therapy;Passive range of motion;Splinting;Therapeutic activities;Balance training;Patient/family education    Plan  upgrade HEP to include gym activities, monitor R shoulder pain, address increasing proximal strength while maintaining alignment, functional overhead reach, NMR for RUE/trunk and grip strength, functional use of RUE    Consulted and Agree with Plan of Care  Patient       Patient will benefit from skilled therapeutic intervention in order to improve the following deficits and impairments:  Abnormal gait, Decreased activity tolerance, Decreased balance, Decreased coordination, Decreased knowledge of use of DME, Decreased mobility, Decreased range of motion, Difficulty walking, Decreased strength, Impaired UE functional use, Impaired tone, Pain  Visit Diagnosis: Hemiplegia and hemiparesis following cerebral infarction affecting right non-dominant side (HCC)  Other disturbances of skin sensation  Unsteadiness on feet  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 Abdominal pain   . Weakness   . CVA (cerebral vascular accident) (HCC) 07/25/2017  . Right sided weakness 07/25/2017  . Hypokalemia 07/25/2017  . Neck pain on right side 07/25/2017  . Right-sided face pain 07/25/2017  . RUQ abdominal pain 07/25/2017  . Hypertension   . HLD (hyperlipidemia)   . Hypothyroidism     Norton Pastel, OTR/L 10/05/2017, 9:09 AM  [facility] [address] [place], [place], [address] Phone: [phone]   Fax:  [phone]  Name:  Ariatna Jester MRN: [id number] Date of Birth: January 06, 1956

## 2017-10-06 NOTE — Therapy (Signed)
[facility] [address] [place], [place], [address] Phone: [phone]   Fax:  [phone]  Physical Therapy Treatment  Patient Details  Name: Kristin Mahoney MRN: [id number] Date of Birth: [birth year] Referring Provider: Lorelei Pont, MD   Encounter Date: 10/05/2017   10/05/17 1021  PT Visits / Re-Eval  Visit Number 18  Number of Visits 30  Date for PT Re-Evaluation 10/30/17  Authorization  Authorization Type [employer]  PT Time Calculation  PT Start Time 1018  PT Stop Time 1100  PT Time Calculation (min) 42 min  PT - End of Session  Equipment Utilized During Treatment Gait belt  Activity Tolerance Patient tolerated treatment well  Behavior During Therapy [facility] for tasks assessed/performed     Past Medical History:  Diagnosis Date  . HLD (hyperlipidemia)   . Hypertension   . Hypothyroidism   . Stroke ([place])   . Thyroid disease     Past Surgical History:  Procedure Laterality Date  . APPENDECTOMY      There were no vitals filed for this visit.     10/05/17 1019  Symptoms/Limitations  Subjective No new complains. Has ordered foot up brace, just not in as yet. Pt to bring in to therapy when she recieves it. No falls. Does endorce right shoulder pain that is better after seeing OT this am.   Patient is accompained by: Family member  Pertinent History HTN, CVA, thyroid disease  Limitations Walking  Patient Stated Goals Use the Bioness for her RLE and stop using the cane.  Pain Assessment  Currently in Pain? Yes  Pain Score 2  Pain Location Shoulder  Pain Orientation Right  Pain Descriptors / Indicators Nagging  Pain Type Chronic pain  Pain Onset More than a month ago  Pain Frequency Constant  Aggravating Factors  unsure, "it just hurts"  Pain Relieving Factors ice, muscle creame, stretching      10/05/17 1022  Transfers  Transfers Sit to Stand;Stand to Sit  Sit to Stand 6:  Modified independent (Device/Increase time)  Stand to Sit 6: Modified independent (Device/Increase time)  Ambulation/Gait  Ambulation/Gait Yes  Ambulation/Gait Assistance 4: Min guard;4: Min assist  Ambulation/Gait Assistance Details 1st rep with foot up brace only with min guard to min assist; 2cd  lap with foot up brace and cane with supervision to min guard assist toward end of gait session.   Ambulation Distance (Feet) 115 Feet (x1, 230 x1, plus around gym with cane/foot up)  Assistive device None  Stairs Yes  Stairs Assistance 6: Modified independent (Device/Increase time)  Stairs Assistance Details (indicate cue type and reason) no cues or assistance needed, foot up brace donned  Stair Management Technique One rail Right;Alternating pattern;Forwards  Number of Stairs 4  Height of Stairs 6  Neuro Re-ed   Neuro Re-ed Details  for balance/strengthening/proprioception: with foot up brace donned at bottom of stairs with single UE support: foot taps up/donw 3 steps with emphasis on knee stability in stance, hip/knee flexion with taps (vs sliding foot along) x 10 reps each leg, min guard assist with cues on stance control, form and weight shifting.   Exercises  Other Exercises  hooklying on mat table: single leg bridge with right LE with 5 sec's x 10 reps; bil bridge with alternating knee extension x 10 each side. rest breaks taken between reps as needed; in tall kneeling- mini squats x 10 reps with emphasis on equal LE weight bearing with return to midline,  then alternating side stepping out/in x 10 reps each side with cues/emphasis on tall posture and weight shifting, progressing to alternating fwd/bwd stepping x 10 reps each side with cues on posture. all tall kneeling with UE support on red pball for balance assist. min guard to min assist for balance. facilitation as needed for posture and weight shifting as needed.                      PT Short Term Goals - 09/30/17 0951      PT SHORT  TERM GOAL #1   Title  Pt will perform DGI assessment without use of Cane but with Bioness donned    Baseline  DGI performed without cane and with foot up brace    Time  4    Period  Weeks    Status  Partially Met      PT SHORT TERM GOAL #2   Title  Pt will demonstrate increased gait speed to >/= 2.0 ft/sec with cane and Bioness; 1.8 ft/sec without cane (but with Bioness)    Baseline  1.6 without cane but with foot up brace, with cane and foot up brace, 2.0 ft/sec with cane and foot up     Time  4    Period  Weeks    Status  Achieved      PT SHORT TERM GOAL #3   Title  Pt will ambulate 230' over indoor surfaces and will negotiate 4 stairs with one rail, alternating sequence with supervision, no cane but with Bioness    Time  4    Period  Weeks    Status  On-going      PT SHORT TERM GOAL #4   Title  Pt will improve BERG to >50/56    Baseline  45/56, 51/56 with foot up brace    Time  4    Period  Weeks    Status  Achieved        PT Long Term Goals - 08/30/17 1118      PT LONG TERM GOAL #1   Title  Pt will demonstrate independence with LE strengthening and balance HEP    Time  8    Period  Weeks    Status  On-going    Target Date  10/30/17      PT LONG TERM GOAL #2   Title  If approved for home Bioness unit; pt will demonstrate independence with donning, care, skin check and wear schedule    Time  8    Period  Weeks    Status  On-going    Target Date  10/30/17      PT LONG TERM GOAL #3   Title  Pt will improve gait velocity to >/= 2.0 ft/sec with Bioness but no cane to improve safety as community ambulator    Time  8    Period  Weeks    Status  Revised    Target Date  10/30/17      PT LONG TERM GOAL #4   Title  Pt will improve DGI by 4 points (with Bioness, no Cane) to decrease falls risk    Baseline  TBD without Cane    Time  8    Period  Weeks    Status  Revised    Target Date  10/30/17      PT LONG TERM GOAL #5   Title  Pt will improve BERG score to >/ =  53/56  to decrease falls risk    Time  8    Period  Weeks    Status  Revised    Target Date  10/30/17      PT LONG TERM GOAL #6   Title  WITH BIONESS donned:  ambulate >230' over indoor surfaces without cane; 200' over outdoor paved surfaces (with cane); negotiate 4 stairs with one rail, alternating sequence, NO cane at MOD I with improved RLE mechanics    Time  8    Period  Weeks    Status  Revised    Target Date  10/30/17         10/05/17 1021  Plan  Clinical Impression Statement Pt met her remaining STG #3 today with use of foot up brace. Remainder of session continued to focus on right LE strengthening and balance with emphasis on right weight bearing/stance control. Pt is progressing toward goals and should benefit from continued PT to progress toward unmet goals   Pt will benefit from skilled therapeutic intervention in order to improve on the following deficits Abnormal gait;Decreased balance;Decreased strength;Difficulty walking;Impaired sensation;Pain;Impaired tone  Rehab Potential Good  PT Frequency 2x / week  PT Duration 8 weeks  PT Treatment/Interventions ADLs/Self Care Home Management;Electrical Stimulation;DME Instruction;Gait training;Stair training;Functional mobility training;Therapeutic activities;Therapeutic exercise;Balance training;Neuromuscular re-education;Patient/family education;Passive range of motion  PT Next Visit Plan   Gait with foot up without cane.  Strengthening with Bioness  Consulted and Agree with Plan of Care Patient;Family member/caregiver  Family Member Consulted significant other       Patient will benefit from skilled therapeutic intervention in order to improve the following deficits and impairments:  Abnormal gait, Decreased balance, Decreased strength, Difficulty walking, Impaired sensation, Pain, Impaired tone  Visit Diagnosis: Hemiplegia and hemiparesis following cerebral infarction affecting right non-dominant side (HCC)  Unsteadiness  on feet  Other abnormalities of gait and mobility  Foot drop, right     Problem List Patient Active Problem List   Diagnosis Date Noted  . Abdominal pain   . Weakness   . CVA (cerebral vascular accident) ([place]) 07/25/2017  . Right sided weakness 07/25/2017  . Hypokalemia 07/25/2017  . Neck pain on right side 07/25/2017  . Right-sided face pain 07/25/2017  . RUQ abdominal pain 07/25/2017  . Hypertension   . HLD (hyperlipidemia)   . Hypothyroidism     Willow Ora, PTA, [facility] [address] [place],  [address] [phone] 10/06/17, 10:32 PM   Name: Kristin Mahoney MRN: [id number] Date of Birth: Apr 19, 1956

## 2017-10-07 ENCOUNTER — Ambulatory Visit: Payer: Medicare HMO | Admitting: Occupational Therapy

## 2017-10-07 ENCOUNTER — Ambulatory Visit: Payer: Medicare HMO | Admitting: Rehabilitation

## 2017-10-07 ENCOUNTER — Ambulatory Visit: Payer: Medicare HMO | Admitting: Physical Therapy

## 2017-10-07 ENCOUNTER — Encounter: Payer: Self-pay | Admitting: Occupational Therapy

## 2017-10-07 ENCOUNTER — Encounter: Payer: Medicare HMO | Admitting: Occupational Therapy

## 2017-10-07 DIAGNOSIS — M6281 Muscle weakness (generalized): Secondary | ICD-10-CM

## 2017-10-07 DIAGNOSIS — R2689 Other abnormalities of gait and mobility: Secondary | ICD-10-CM

## 2017-10-07 DIAGNOSIS — R2681 Unsteadiness on feet: Secondary | ICD-10-CM

## 2017-10-07 DIAGNOSIS — M25511 Pain in right shoulder: Secondary | ICD-10-CM

## 2017-10-07 DIAGNOSIS — M21371 Foot drop, right foot: Secondary | ICD-10-CM

## 2017-10-07 DIAGNOSIS — R208 Other disturbances of skin sensation: Secondary | ICD-10-CM

## 2017-10-07 DIAGNOSIS — I69353 Hemiplegia and hemiparesis following cerebral infarction affecting right non-dominant side: Secondary | ICD-10-CM

## 2017-10-07 DIAGNOSIS — M25611 Stiffness of right shoulder, not elsewhere classified: Secondary | ICD-10-CM

## 2017-10-07 DIAGNOSIS — G8929 Other chronic pain: Secondary | ICD-10-CM

## 2017-10-07 NOTE — Patient Instructions (Signed)
Recommendations for Silver Sneakers class:  These recommendations should allow you to participate in your class without incurring shoulder pain!  Make sure you don't hike your right shoulder when doing activities and try and keep you elbow straight when raising your arm ( only go to 90* if using weight).  USE PAIN AS YOUR GUIDE!!! Pain in the shoulder is never good!!! Stop short of getting pain when doing these activities.   1. Arm exercises: - Avoid full overhead reach with your R arm when in sitting.  When the class is doing this with weight, you can use a 2 pound weight and just reach to 90* (not overhead).  If you want to use weight with overhead lay on a weight bench and then you can use 2 pounds and go overhead as long as you have no pain. - Using the ball:  All exercises looked good - just be careful not to go too far when raising the ball overhead.  Use pain as your guide as well as making sure you don't hike your shoulder. - Using exercise bands:  Bicep curls are fine.  When reaching overhead only go to 90*.  Make sure you don't hike your shoulder and keep your elbow straight.  Do not use a band with so much resistance that you have to bend your elbow and/or hike your shoulder.   - When using dumbbells stick with 2 pounds and only do overhead with your Right shoulder to 90* (again you can lay down on a weight bench if you want to go further overhead with the 2 pound weight).  - The elliptical is great machine for you because it works everything and keeps your arms at lower level. Go slow when doing your exercises - this will increase the demand and help you avoid using momentum.  We added: 1. Planks on knees 2. All fours, alternating lifting each leg.

## 2017-10-07 NOTE — Patient Instructions (Signed)
  Stretching: Gastroc    Stand with right foot back, leg straight, forward leg bent. Keeping heel on floor, turned slightly out, lean into wall until stretch is felt in calf. Hold _30-45___ seconds. Repeat _1___ times per set. Do _1___ sets per session. Do _2-3___ sessions per day.  Bridge With Atmos EnergyKnee Bend    Lie on back, both legs on top of ball, knees slightly flexed, arms on floor. Tighten buttocks while keeping abdominals tight and raise hips _6_ inches off floor. Do _2__ sets of __6_ repetitions.    Bridge With Leg Curl    Lie on back, both legs on top of ball, knees straight, arms on floor. Tighten buttocks, keeping abdominals tight and raise hips _6__ inches off floor. Hold lift and curl ball toward buttocks. Return legs to starting position, keeping pelvis elevated. Do _2__ sets of __5_ repetitions.   Quadruped: Hip Abduction / Knee Flexion (Active)    On hands and knees, lift right bent knee out to the side. Bring it back down and repeat on other side.  Pull in stomach before bringing leg out to the side to stabilize. Complete _1__ sets of _10__ repetitions, alternating legs.  Quadruped: Hip Extension / Knee Flexion (Active)    On hands and knees, lift right leg with knee straight. Bend knee. Switch legs. Complete _1__ sets of _10__ repetitions, alternating legs.   SIT TO STAND: Feet in Modified Tandem    Place left foot forwards, right foot back.  Both hands on THE BALL in front of you. Lean chest forward, rolling ball forwards. Raise hips off the seat half way and hold 2-3 seconds.  As you sit, keep your weight over the right leg.   _8-10__ reps per set.  Marching    Sitting on ball, with one hand on support beside you. March in place keeping your chest up tall, 10 times, alternating legs.   Feet Heel-Toe "Tandem"    One hand holding counter top, other hand holding cane, walk a straight line bringing one foot directly in front of the  other. Repeat for 4 laps per session. Do __2__ sessions per day.    Single Leg - Eyes Open    Holding on to counter top, stand on an old pillow or folded up blanket (soft surface).  Lift one leg and keep your balance for 10 seconds.  Switch legs.  Perform 2-3 times on each leg.

## 2017-10-07 NOTE — Therapy (Signed)
[facility] [address] [place], [place], [address] Phone: [phone]   Fax:  [phone]  Physical Therapy Treatment  Patient Details  Name: Kristin Mahoney MRN: [id number] Date of Birth: 10-01-1955 Referring Provider: Lorelei Pont, MD   Encounter Date: 10/07/2017  PT End of Session - 10/07/17 1224    Visit Number  19    Number of Visits  30    Date for PT Re-Evaluation  10/30/17    Authorization Type  [employer] H[employer]    PT Start Time  [phone]    PT Stop Time  1021    PT Time Calculation (min)  43 min    Activity Tolerance  Patient tolerated treatment well    Behavior During Therapy  [facility] for tasks assessed/performed       Past Medical History:  Diagnosis Date  . HLD (hyperlipidemia)   . Hypertension   . Hypothyroidism   . Stroke ([place])   . Thyroid disease     Past Surgical History:  Procedure Laterality Date  . APPENDECTOMY      There were no vitals filed for this visit.  Subjective Assessment - 10/07/17 0941    Subjective  Bought foot up brace; will bring it next session.  Worked on UE exercises for the gym with OT.    Patient is accompained by:  Family member    Pertinent History  HTN, CVA, thyroid disease    Limitations  Walking    Patient Stated Goals  Use the Bioness for her RLE and stop using the cane.    Currently in Pain?  No/denies    Pain Onset  More than a month ago       Reviewed and added the following exercises to HEP, removing previous exercises.  Maintained tandem gait and SLS but added to SLS - standing on compliant surface for home.   Stretching: Gastroc    Stand with right foot back, leg straight, forward leg bent. Keeping heel on floor, turned slightly out, lean into wall until stretch is felt in calf. Hold _30-45___ seconds. Repeat _1___ times per set. Do _1___ sets per session. Do _2-3___ sessions per day.  Bridge With [employer] on back, both legs on  top of ball, knees slightly flexed, arms on floor. Tighten buttocks while keeping abdominals tight and raise hips _6_ inches off floor. Do _2__ sets of __6_ repetitions.    Bridge With Leg Curl    Lie on back, both legs on top of ball, knees straight, arms on floor. Tighten buttocks, keeping abdominals tight and raise hips _6__ inches off floor. Hold lift and curl ball toward buttocks. Return legs to starting position, keeping pelvis elevated. Do _2__ sets of __5_ repetitions.   Quadruped: Hip Abduction / Knee Flexion (Active)    On hands and knees, lift right bent knee out to the side. Bring it back down and repeat on other side.  Pull in stomach before bringing leg out to the side to stabilize. Complete _1__ sets of _10__ repetitions, alternating legs.  Quadruped: Hip Extension / Knee Flexion (Active)    On hands and knees, lift right leg with knee straight. Bend knee. Switch legs. Complete _1__ sets of _10__ repetitions, alternating legs.   SIT TO STAND: Feet in Modified Tandem    Place left foot forwards, right foot back.  Both hands on THE BALL in front of you. Lean chest forward, rolling ball forwards. Raise hips off  the seat half way and hold 2-3 seconds.  As you sit, keep your weight over the right leg.   _8-10__ reps per set.  Marching    Sitting on ball, with one hand on support beside you. March in place keeping your chest up tall, 10 times, alternating legs.       PT Education - 10/07/17 1223    Education provided  Yes    Education Details  progressed HEP    Person(s) Educated  Patient    Methods  Explanation;Demonstration;Handout    Comprehension  Verbalized understanding;Returned demonstration       PT Short Term Goals - 10/05/17 2223      PT SHORT TERM GOAL #1   Title  Pt will perform DGI assessment without use of Cane but with Bioness donned    Baseline  DGI performed without cane and with foot up brace    Status  Partially Met      PT  SHORT TERM GOAL #2   Title  Pt will demonstrate increased gait speed to >/= 2.0 ft/sec with cane and Bioness; 1.8 ft/sec without cane (but with Bioness)    Baseline  1.6 without cane but with foot up brace, with cane and foot up brace, 2.0 ft/sec with cane and foot up     Status  Achieved      PT SHORT TERM GOAL #3   Title  Pt will ambulate 230' over indoor surfaces and will negotiate 4 stairs with one rail, alternating sequence with supervision, no cane but with Bioness    Baseline  10/05/17: met with use of foot up brace    Status  Achieved      PT SHORT TERM GOAL #4   Title  Pt will improve BERG to >50/56    Baseline  45/56, 51/56 with foot up brace    Status  Achieved        PT Long Term Goals - 08/30/17 1118      PT LONG TERM GOAL #1   Title  Pt will demonstrate independence with LE strengthening and balance HEP    Time  8    Period  Weeks    Status  On-going    Target Date  10/30/17      PT LONG TERM GOAL #2   Title  If approved for home Bioness unit; pt will demonstrate independence with donning, care, skin check and wear schedule    Time  8    Period  Weeks    Status  On-going    Target Date  10/30/17      PT LONG TERM GOAL #3   Title  Pt will improve gait velocity to >/= 2.0 ft/sec with Bioness but no cane to improve safety as community ambulator    Time  8    Period  Weeks    Status  Revised    Target Date  10/30/17      PT LONG TERM GOAL #4   Title  Pt will improve DGI by 4 points (with Bioness, no Cane) to decrease falls risk    Baseline  TBD without Cane    Time  8    Period  Weeks    Status  Revised    Target Date  10/30/17      PT LONG TERM GOAL #5   Title  Pt will improve BERG score to >/ = 53/56 to decrease falls risk    Time  8  Period  Weeks    Status  Revised    Target Date  10/30/17      PT LONG TERM GOAL #6   Title  WITH BIONESS donned:  ambulate >230' over indoor surfaces without cane; 200' over outdoor paved surfaces (with cane);  negotiate 4 stairs with one rail, alternating sequence, NO cane at MOD I with improved RLE mechanics    Time  8    Period  Weeks    Status  Revised    Target Date  10/30/17            Plan - 10/07/17 1224    Clinical Impression Statement  Due to pt progress treatment session focused on upgrading current HEP to focus on further core strengthening, trunk control training and RLE motor control and strengthening to improve proximal stability and gait.  Pt tolerated well, will continue to progress gait without use of cane with pt's foot up brace at next session.    Rehab Potential  Good    PT Frequency  2x / week    PT Duration  8 weeks    PT Treatment/Interventions  ADLs/Self Care Home Management;Electrical Stimulation;DME Instruction;Gait training;Stair training;Functional mobility training;Therapeutic activities;Therapeutic exercise;Balance training;Neuromuscular re-education;Patient/family education;Passive range of motion    PT Next Visit Plan    Gait with her foot up without cane.  Strengthening with Bioness.  How is she tolerating new HEP?    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 Abdominal pain   . Weakness   . CVA (cerebral vascular accident) ([place]) 07/25/2017  . Right sided weakness 07/25/2017  . Hypokalemia 07/25/2017  . Neck pain on right side  07/25/2017  . Right-sided face pain 07/25/2017  . RUQ abdominal pain 07/25/2017  . Hypertension   . HLD (hyperlipidemia)   . Hypothyroidism     Rico Junker, PT, DPT 10/07/17    12:29 PM    [facility] [address] [place], [place], [address] Phone: [phone]   Fax:  [phone]  Name: Taisley Mordan MRN: [id number] Date of Birth: 1955/07/05

## 2017-10-07 NOTE — Therapy (Signed)
[facility] [address] [place], [place], [address] Phone: [phone]   Fax:  [phone]  Occupational Therapy Treatment  Patient Details  Name: Kristin Mahoney MRN: [id number] Date of Birth: [birth year] Referring Provider: Dennard Schaumann, MD   Encounter Date: 10/07/2017  OT End of Session - 10/07/17 1233    Visit Number  10    Number of Visits  24    Date for OT Re-Evaluation  11/08/17    Authorization Type  Medicare    Authorization Time Period  90 days    OT Start Time  0848    OT Stop Time  0930    OT Time Calculation (min)  42 min    Activity Tolerance  Patient tolerated treatment well       Past Medical History:  Diagnosis Date  . HLD (hyperlipidemia)   . Hypertension   . Hypothyroidism   . Stroke ([facility])   . Thyroid disease     Past Surgical History:  Procedure Laterality Date  . APPENDECTOMY      There were no vitals filed for this visit.  Subjective Assessment - 10/07/17 0855    Subjective   My shoulder doesn't hurt it is more like stiffness    Patient is accompained by:  Family member boyfriend in waiting room    Pertinent History  Original stroke in 2011 but pt had recent admission on 07/25/2017-07/26/2017 with increased weakness. Imaging was negative.      Patient Stated Goals  I want the pain to go away so I can move the right arm better - to reach into cabinets in my kitchen without pain.    Currently in Pain?  No/denies                   OT Treatments/Exercises (OP) - 10/07/17 0001      Neurological Re-education Exercises   Other Exercises 1  Worked with pt to address development of exercise program for UE's that pt can do at the gym. Pt participates in Silver Sneakers 4-5x/week in a 45  minute exercise class.  Educated pt on things to avoid and then went through activities of the  class and modified as needed. Pt verbalized understanding of recommendations and returned  demonstrated. Much of what pt was doing prior to starting therapy in this clinic was contributing to R shoulder pain.  Pt verbalized understanding. Also instructed pt in activities in quadruped as well as planks to address proximal strength using body weight. Pt able to return demonstate.  Will review next session to ensure consistent carry over and pt to share recommendations with exercise instructor. Also addressed pt being able to get coins out of pocket and manipulate coins in R hand functionally.             OT Education - 10/07/17 1229    Education provided  Yes    Education Details  modification of Silver Sneakers exercise class to avoid shoulder pain    Person(s) Educated  Patient    Methods  Explanation;Demonstration;Handout will review and provide handout next session   will review and provide handout next session   Comprehension  Verbalized understanding;Returned demonstration;Need further instruction       OT Short Term Goals - 10/07/17 1230      OT SHORT TERM GOAL #1   Title  Pt will be mod I with HEP for RUE ROM/strength, coordination, and grip strength - 09/13/2017    Status  Achieved  OT SHORT TERM GOAL #2   Title  Pt will be able to tolerate reaching to 110* of shoulder flexion to pick up light object  with no more than 2/10 pain    Status  Achieved      OT SHORT TERM GOAL #3   Title  Pt will demonstrate improved R grip strength by at least 5 pounds to assist with opening jars when cooking (baseline= 40)    Status  Achieved 10/05/2017  52 pounds      OT SHORT TERM GOAL #4   Title  Pt will report greater ease in buttoning.     Status  Achieved      OT SHORT TERM GOAL #5   Title  Pt will demonstrate improved postural alignment in standing to decrease R shoulder pain with overhead reaching activities    Status  Achieved        OT Long Term Goals - 10/07/17 1230      OT LONG TERM GOAL #1   Title  Pt will be mod I with upgraded HEP for proximal strength  and reach - 11/08/2017    Status  On-going      OT LONG TERM GOAL #2   Title  Pt will demonstrate ability to reach into overhead cabinet in kitchen with RUE to obtain dishes or food with no more than 2/10 pain    Status  Achieved 09/20/2016  145* of shoulder flexion with functional reach with no pain      OT LONG TERM GOAL #3   Title  Pt will demonstrate improved grip strength by at least 7 pounds R hand to assist with home mgmt tasks (baseline = 40)    Status  Achieved 10/05/2017  52 pounds      OT LONG TERM GOAL #4   Title  Pt will demonstrate ability to get change out of pocket using R hand    Status  Achieved      OT LONG TERM GOAL #5   Title  Pt will demonstrate improved postural aligment with functional ambulation to decrease pain in R shoulder and improve ability to carry at least a 2 pound object when walking.    Status  On-going            Plan - 10/07/17 1232    Clinical Impression Statement  Pt continues to progress toward goals. Pt very motivated to return to full function with RUE    Occupational Profile and client history currently impacting functional performance  L MCA CVA 2011, HTN, thyroid disease, HLD, chronic shoulder pain, recent episode of increased R weakness    Occupational performance deficits (Please refer to evaluation for details):  ADL's;IADL's;Rest and Sleep;Leisure;Social Participation    Rehab Potential  Good    Current Impairments/barriers affecting progress:  chronic nature of R shoulder pain, length of time since inital onset of stroke    OT Frequency  2x / week    OT Duration  12 weeks    OT Treatment/Interventions  Self-care/ADL training;Aquatic Therapy;Cryotherapy;Moist Heat;Electrical Stimulation;DME and/or AE instruction;Neuromuscular education;Therapeutic exercise;Functional Mobility Training;Manual Therapy;Passive range of motion;Splinting;Therapeutic activities;Balance training;Patient/family education    Plan  upgrade HEP to include gym  activities, monitor R shoulder pain, address increasing proximal strength while maintaining alignment, functional overhead reach, NMR for RUE/trunk and grip strength, functional use of RUE    Consulted and Agree with Plan of Care  Patient       Patient will benefit from skilled therapeutic intervention in  order to improve the following deficits and impairments:  Abnormal gait, Decreased activity tolerance, Decreased balance, Decreased coordination, Decreased knowledge of use of DME, Decreased mobility, Decreased range of motion, Difficulty walking, Decreased strength, Impaired UE functional use, Impaired tone, Pain  Visit Diagnosis: Unsteadiness on feet  Other disturbances of skin sensation  Muscle weakness (generalized)  Stiffness of right shoulder joint  Chronic right shoulder pain  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 Abdominal pain   . Weakness   . CVA (cerebral vascular accident) (H[facility]) 07/25/2017  . Right sided weakness 07/25/2017  . Hypokalemia 07/25/2017  . Neck pain on right side 07/25/2017  . Right-sided face pain 07/25/2017  . RUQ abdominal pain 07/25/2017  . Hypertension   . HLD (hyperlipidemia)   . Hypothyroidism     Norton Pastel, OTR/L 10/07/2017, 12:34 PM  [facility] [address] [place], [place], [address] Phone: [phone]   Fax:  [phone]  Name: Mady Oubre MRN: [id number] Date of Birth: 1955-10-30

## 2017-10-11 ENCOUNTER — Encounter: Payer: Self-pay | Admitting: Rehabilitation

## 2017-10-11 ENCOUNTER — Ambulatory Visit: Payer: Medicare HMO | Admitting: Rehabilitation

## 2017-10-11 ENCOUNTER — Ambulatory Visit: Payer: Medicare HMO | Admitting: Occupational Therapy

## 2017-10-11 ENCOUNTER — Encounter: Payer: Self-pay | Admitting: Occupational Therapy

## 2017-10-11 DIAGNOSIS — M25611 Stiffness of right shoulder, not elsewhere classified: Secondary | ICD-10-CM

## 2017-10-11 DIAGNOSIS — M6281 Muscle weakness (generalized): Secondary | ICD-10-CM

## 2017-10-11 DIAGNOSIS — R2689 Other abnormalities of gait and mobility: Secondary | ICD-10-CM

## 2017-10-11 DIAGNOSIS — G8929 Other chronic pain: Secondary | ICD-10-CM

## 2017-10-11 DIAGNOSIS — M25511 Pain in right shoulder: Secondary | ICD-10-CM

## 2017-10-11 DIAGNOSIS — I69353 Hemiplegia and hemiparesis following cerebral infarction affecting right non-dominant side: Secondary | ICD-10-CM

## 2017-10-11 DIAGNOSIS — R2681 Unsteadiness on feet: Secondary | ICD-10-CM

## 2017-10-11 DIAGNOSIS — R208 Other disturbances of skin sensation: Principal | ICD-10-CM

## 2017-10-11 NOTE — Patient Instructions (Addendum)
Recommendations for Silver Sneakers class:  These recommendations should allow you to participate in your class without incurring shoulder pain!  Make sure you don't hike your right shoulder when doing activities and try and keep you elbow straight when raising your arm ( only go to 90* if using weight).  USE PAIN AS YOUR GUIDE!!! Pain in the shoulder is never good!!! Stop short of getting pain when doing these activities.   1. Arm exercises: - Avoid full overhead reach with your R arm when in sitting.  When the class is doing this with weight, you can use a 2 pound weight and just reach to 90* (not overhead).  If you want to use weight with overhead lay on a weight bench and then you can use 2 pounds and go overhead as long as you have no pain. - Using the ball:  All exercises looked good - just be careful not to go too far when raising the ball overhead.  Use pain as your guide as well as making sure you don't hike your shoulder. - Using exercise bands:  Bicep curls are fine.  When reaching overhead only go to 90*.  Make sure you don't hike your shoulder and keep your elbow straight.  Do not use a band with so much resistance that you have to bend your elbow and/or hike your shoulder.   - When using dumbbells stick with 2 pounds and only do overhead with your Right shoulder to 90* (again you can lay down on a weight bench if you want to go further overhead with the 2 pound weight).  - The elliptical is great machine for you because it works everything and keeps your arms at lower level. Go slow when doing your exercises - this will increase the demand and help you avoid using momentum.  We added: 1. Planks on knees.  Use the timer on your phone to try and increase the time you can hold the position. In the clinic you did between 16-20 seconds.  Do 3-5 as you tolerate. Increase the demand by either increasing the reps OR increasing your holding time.  Pick one or the other when you are going to  increase not both at the same time.   2. All fours, alternating lifting each leg.  Do 5 on each leg. 3. Lay on your back and hold a 2 pound weight in your hand.  Bend your knees up to protect your back.  Tuck your chin and punch across your body until your elbow is straight and your shoulder on the that side lifts off the floor. Do 5 then switch sides. Do 2 sets of 5 - do not do more because it can cause your neck to get sore.   4. For your hand strength you can add the Digi-Flex. You will need the 3 pound one (red) and we found it on [employer] for $13.50.  Prop your forearm on the table if you find that you are using your whole arm to squeeze it.  Start with 10 reps and do one set in the morning and 10 in the evening.  If you do not any hand pain you can increase the reps SLOWLY over time.

## 2017-10-11 NOTE — Therapy (Signed)
[facility] [address] [place], [place], [address] Phone: [phone]   Fax:  [phone]  Physical Therapy Treatment  Patient Details  Name: Kristin Mahoney MRN: [id number] Date of Birth: 03/06/1956 Referring Provider: Lorelei Pont, MD   Encounter Date: 10/11/2017  PT End of Session - 10/11/17 1306    Visit Number  20    Number of Visits  30    Date for PT Re-Evaluation  10/30/17    Authorization Type  [employer] H[employer]    PT Start Time  0848    PT Stop Time  0930    PT Time Calculation (min)  42 min    Activity Tolerance  Patient tolerated treatment well    Behavior During Therapy  [facility] for tasks assessed/performed       Past Medical History:  Diagnosis Date  . HLD (hyperlipidemia)   . Hypertension   . Hypothyroidism   . Stroke ([place])   . Thyroid disease     Past Surgical History:  Procedure Laterality Date  . APPENDECTOMY      There were no vitals filed for this visit.  Subjective Assessment - 10/11/17 0849    Subjective  Pt reports that she did not get brace yet.  Got a book instead of brace, is working on sending it back.     Pertinent History  HTN, CVA, thyroid disease    Limitations  Walking    Patient Stated Goals  Use the Bioness for her RLE and stop using the cane.    Currently in Pain?  No/denies pain when stretching, pain in R toes, didn't rate                       [facility] Adult PT Treatment/Exercise - 10/11/17 0859      Ambulation/Gait   Ambulation/Gait  Yes    Ambulation/Gait Assistance  6: Modified independent (Device/Increase time)    Ambulation/Gait Assistance Details  Pt able to ambulate on treadmill with foot up brace with BUE support up to speed of 1.3 at mod I level (did provide min cues for L step width) but does very well.  Pt reports she does not get to 1.0 mph at gym, therefore encouraged her to increase speed to increase cardiovascular workout.   Continued ambulation throughout session without AD with foot up brace donned x 115'.  Note during first lap around track, she tends to still intermittently catch R toe, but also she is trying to increase R hip and knee flexion which is then increasing tone.  Therefore donned shoe cover on R shoe and had her do minimal effort to clear RLE.  This did seem to improve fluidity of gait during session.  Performed in this manner x 230'.  Provided tactile cues at R scapula/ribs for more upright trunk during forward movement.  Note intermittent R knee recurvatum as well.  Upon resting after another 50' she reports pain in R toes and that this has been going on since CVA as toes curl on her and there is a lot of pressure on toes causing ingrown toenails. PT assessed range in toes which is normal, however if PT did not stretch and place them in aligned position prior to standing, she was unable to maintain toes in extended position despite fully loading RLE.   See NMR for more details.     Ambulation Distance (Feet)  115 Feet 230' then 115'    Assistive device  None foot up brace on RLE    Gait Pattern  Step-through pattern;Right steppage;Right genu recurvatum;Poor foot clearance - right    Ambulation Surface  Level;Indoor      Self-Care   Self-Care  Other Self-Care Comments    Other Self-Care Comments   Provided education on performing calf stretch to less intense degree at home due to pt overstretching and having pain in L LE.       Neuro Re-ed    Neuro Re-ed Details   Had pt stand with LUE near chair for support stepping LLE forward/backwards progressing to stepping up to cone and back to floor in efforts to assess if RLE fully loaded into WB position does tone decrease and allow toes to relax.  She was unable to maintain toes in extension despite WB except when PT placed and held in extended position.  OT present during this part of session and discussed making toe/foot orthotic to maintain position.  PT also  addressed seeing podiatrist in order to address ingrown toenail.  Also discussed benefits of botox if this does not work to allow toes to relax when during gait.  Pt verbalized understanding.               PT Education - 10/11/17 1306    Education provided  Yes    Education Details  see NMR section    Person(s) Educated  Patient    Methods  Explanation    Comprehension  Verbalized understanding       PT Short Term Goals - 10/05/17 2223      PT SHORT TERM GOAL #1   Title  Pt will perform DGI assessment without use of Cane but with Bioness donned    Baseline  DGI performed without cane and with foot up brace    Status  Partially Met      PT SHORT TERM GOAL #2   Title  Pt will demonstrate increased gait speed to >/= 2.0 ft/sec with cane and Bioness; 1.8 ft/sec without cane (but with Bioness)    Baseline  1.6 without cane but with foot up brace, with cane and foot up brace, 2.0 ft/sec with cane and foot up     Status  Achieved      PT SHORT TERM GOAL #3   Title  Pt will ambulate 230' over indoor surfaces and will negotiate 4 stairs with one rail, alternating sequence with supervision, no cane but with Bioness    Baseline  10/05/17: met with use of foot up brace    Status  Achieved      PT SHORT TERM GOAL #4   Title  Pt will improve BERG to >50/56    Baseline  45/56, 51/56 with foot up brace    Status  Achieved        PT Long Term Goals - 08/30/17 1118      PT LONG TERM GOAL #1   Title  Pt will demonstrate independence with LE strengthening and balance HEP    Time  8    Period  Weeks    Status  On-going    Target Date  10/30/17      PT LONG TERM GOAL #2   Title  If approved for home Bioness unit; pt will demonstrate independence with donning, care, skin check and wear schedule    Time  8    Period  Weeks    Status  On-going    Target Date  10/30/17  PT LONG TERM GOAL #3   Title  Pt will improve gait velocity to >/= 2.0 ft/sec with Bioness but no cane to  improve safety as community ambulator    Time  8    Period  Weeks    Status  Revised    Target Date  10/30/17      PT LONG TERM GOAL #4   Title  Pt will improve DGI by 4 points (with Bioness, no Cane) to decrease falls risk    Baseline  TBD without Cane    Time  8    Period  Weeks    Status  Revised    Target Date  10/30/17      PT LONG TERM GOAL #5   Title  Pt will improve BERG score to >/ = 53/56 to decrease falls risk    Time  8    Period  Weeks    Status  Revised    Target Date  10/30/17      PT LONG TERM GOAL #6   Title  WITH BIONESS donned:  ambulate >230' over indoor surfaces without cane; 200' over outdoor paved surfaces (with cane); negotiate 4 stairs with one rail, alternating sequence, NO cane at MOD I with improved RLE mechanics    Time  8    Period  Weeks    Status  Revised    Target Date  10/30/17            Plan - 10/11/17 1306    Clinical Impression Statement  Skilled session focused on continuing to address gait quality with use of foot up brace and without AD. She continues to have intermittent R foot catching, however feel that this is due to increased tone from overt R hip flex and knee flex to clear foot.  Discussed possible need/benefits of toe cap so that pt can more easily clear LE to avoid increase in tone during gait.  Also discsussed increased tone in R toes and addressing ingrown toe nails as this is causing increased pain and likely increased tone during gait not allowing her to fully load RLE.      Rehab Potential  Good    PT Frequency  2x / week    PT Duration  8 weeks    PT Treatment/Interventions  ADLs/Self Care Home Management;Electrical Stimulation;DME Instruction;Gait training;Stair training;Functional mobility training;Therapeutic activities;Therapeutic exercise;Balance training;Neuromuscular re-education;Patient/family education;Passive range of motion    PT Next Visit Plan   See if Santiago Glad was able to find the plans to make that toe/foot  orthotic, work in R stance with toes in extended position, Gait with her foot up without cane.  Strengthening with Bioness.  How is she tolerating new HEP?    Consulted and Agree with Plan of Care  Patient;Family member/caregiver    Family Member Consulted  significant other       Patient will benefit from skilled therapeutic intervention in order to improve the following deficits and impairments:  Abnormal gait, Decreased balance, Decreased strength, Difficulty walking, Impaired sensation, Pain, Impaired tone  Visit Diagnosis: Unsteadiness on feet  Muscle weakness (generalized)  Other abnormalities of gait and mobility  Spastic hemiplegia of right nondominant side as late effect of cerebral infarction [facility])     Problem List Patient Active Problem List   Diagnosis Date Noted  . Abdominal pain   . Weakness   . CVA (cerebral vascular accident) ([place]) 07/25/2017  . Right sided weakness 07/25/2017  . Hypokalemia 07/25/2017  . Neck  pain on right side 07/25/2017  . Right-sided face pain 07/25/2017  . RUQ abdominal pain 07/25/2017  . Hypertension   . HLD (hyperlipidemia)   . Hypothyroidism     Cameron Sprang, PT, MPT [facility] [address] [place], [place], [address] Phone: [phone]   Fax:  [phone] 10/11/17, 1:10 PM  Name: Zykera Abella MRN: [id number] Date of Birth: 1956/04/28

## 2017-10-11 NOTE — Therapy (Signed)
[facility] [address] [place], [place], [address] Phone: [phone]   Fax:  [phone]  Occupational Therapy Treatment  Patient Details  Name: Kristin Mahoney MRN: [id number] Date of Birth: 05/09/1955 Referring Provider: Lorelei Pont, MD   Encounter Date: 10/11/2017  OT End of Session - 10/11/17 0904    Visit Number  11    Number of Visits  24    Date for OT Re-Evaluation  11/08/17    Authorization Type  Medicare    Authorization Time Period  90 days    OT Start Time  0801    OT Stop Time  0845    OT Time Calculation (min)  44 min    Activity Tolerance  Patient tolerated treatment well       Past Medical History:  Diagnosis Date  . HLD (hyperlipidemia)   . Hypertension   . Hypothyroidism   . Stroke ([place])   . Thyroid disease     Past Surgical History:  Procedure Laterality Date  . APPENDECTOMY      There were no vitals filed for this visit.  Subjective Assessment - 10/11/17 0807    Subjective   I am glad I can still do my class at the gym    Patient is accompained by:  Family member boyfriend    Pertinent History  Original stroke in 2011 but pt had recent admission on 07/25/2017-07/26/2017 with increased weakness. Imaging was negative.      Patient Stated Goals  I want the pain to go away so I can move the right arm better - to reach into cabinets in my kitchen without pain.    Currently in Pain?  No/denies I have pain my knee and shoulder in the morning but none right now                   OT Treatments/Exercises (OP) - 10/11/17 0001      ADLs   ADL Comments  Reviewed anatomy of shoulder girdle with pt to explain biomechanics and why pt experiences pain with certain activities. Pt verbalize understanding.        Neurological Re-education Exercises   Other Exercises 1  Neuro re ed to review and upgrade HEP for pt to return to [employer] class at gym with modifications as  well as to add some home activities.Pt able to return demonstrate all activities and verbalized understanding of all information.               OT Education - 10/11/17 0902    Education provided  Yes    Education Details  upgraded and reviewed HEP as well as biomechanics of shoulder to prevent recurring pain.    Person(s) Educated  Patient    Methods  Explanation;Demonstration;Handout    Comprehension  Verbalized understanding;Returned demonstration       OT Short Term Goals - 10/11/17 0902      OT SHORT TERM GOAL #1   Title  Pt will be mod I with HEP for RUE ROM/strength, coordination, and grip strength - 09/13/2017    Status  Achieved      OT SHORT TERM GOAL #2   Title  Pt will be able to tolerate reaching to 110* of shoulder flexion to pick up light object  with no more than 2/10 pain    Status  Achieved      OT SHORT TERM GOAL #3   Title  Pt will demonstrate improved R grip  strength by at least 5 pounds to assist with opening jars when cooking (baseline= 40)    Status  Achieved 10/05/2017  52 pounds      OT SHORT TERM GOAL #4   Title  Pt will report greater ease in buttoning.     Status  Achieved      OT SHORT TERM GOAL #5   Title  Pt will demonstrate improved postural alignment in standing to decrease R shoulder pain with overhead reaching activities    Status  Achieved        OT Long Term Goals - 10/11/17 0903      OT LONG TERM GOAL #1   Title  Pt will be mod I with upgraded HEP for proximal strength and reach - 11/08/2017    Status  Achieved      OT LONG TERM GOAL #2   Title  Pt will demonstrate ability to reach into overhead cabinet in kitchen with RUE to obtain dishes or food with no more than 2/10 pain    Status  Achieved 09/20/2016  145* of shoulder flexion with functional reach with no pain      OT LONG TERM GOAL #3   Title  Pt will demonstrate improved grip strength by at least 7 pounds R hand to assist with home mgmt tasks (baseline = 40)    Status   Achieved 10/05/2017  52 pounds      OT LONG TERM GOAL #4   Title  Pt will demonstrate ability to get change out of pocket using R hand    Status  Achieved      OT LONG TERM GOAL #5   Title  Pt will demonstrate improved postural aligment with functional ambulation to decrease pain in R shoulder and improve ability to carry at least a 2 pound object when walking.    Status  Achieved            Plan - 10/11/17 0903    Clinical Impression Statement  At this time, pt has met all goals.  Will place pt on hold for 1 month to ensure that pt does not have recurring pain once she returns to the gym.  Pt is aware that if she has any issues she can call to make appts and if no issues arise pt will be d/c from OT in one month. Pt in agreement with plan.     Occupational Profile and client history currently impacting functional performance  L MCA CVA 2011, HTN, thyroid disease, HLD, chronic shoulder pain, recent episode of increased R weakness    Occupational performance deficits (Please refer to evaluation for details):  ADL's;IADL's;Rest and Sleep;Leisure;Social Participation    Rehab Potential  Good    Current Impairments/barriers affecting progress:  chronic nature of R shoulder pain, length of time since inital onset of stroke    OT Frequency  2x / week    OT Duration  12 weeks    OT Treatment/Interventions  Self-care/ADL training;Aquatic Therapy;Cryotherapy;Moist Heat;Electrical Stimulation;DME and/or AE instruction;Neuromuscular education;Therapeutic exercise;Functional Mobility Training;Manual Therapy;Passive range of motion;Splinting;Therapeutic activities;Balance training;Patient/family education    Plan  place on hold and d/c in one month if no issues arise.     Consulted and Agree with Plan of Care  Patient       Patient will benefit from skilled therapeutic intervention in order to improve the following deficits and impairments:  Abnormal gait, Decreased activity tolerance, Decreased  balance, Decreased coordination, Decreased knowledge of use of DME,  Decreased mobility, Decreased range of motion, Difficulty walking, Decreased strength, Impaired UE functional use, Impaired tone, Pain  Visit Diagnosis: Other disturbances of skin sensation  Unsteadiness on feet  Muscle weakness (generalized)  Stiffness of right shoulder joint  Chronic right shoulder pain  Spastic hemiplegia of right nondominant side as late effect of cerebral infarction [facility])    Problem List Patient Active Problem List   Diagnosis Date Noted  . Abdominal pain   . Weakness   . CVA (cerebral vascular accident) ([place]) 07/25/2017  . Right sided weakness 07/25/2017  . Hypokalemia 07/25/2017  . Neck pain on right side 07/25/2017  . Right-sided face pain 07/25/2017  . RUQ abdominal pain 07/25/2017  . Hypertension   . HLD (hyperlipidemia)   . Hypothyroidism     Quay Burow, OTR/L 10/11/2017, 9:06 AM  [facility] [address] [place], [place], [address] Phone: [phone]   Fax:  [phone]  Name: Kristin Mahoney MRN: [id number] Date of Birth: 05-07-1955

## 2017-10-14 ENCOUNTER — Encounter: Payer: Medicare HMO | Admitting: Occupational Therapy

## 2017-10-14 ENCOUNTER — Ambulatory Visit: Payer: Medicare HMO | Admitting: Physical Therapy

## 2017-10-18 ENCOUNTER — Ambulatory Visit: Payer: Medicare HMO | Admitting: Physical Therapy

## 2017-10-22 ENCOUNTER — Ambulatory Visit: Payer: Medicare HMO | Admitting: Physical Therapy

## 2017-10-25 ENCOUNTER — Ambulatory Visit: Payer: Medicare HMO | Admitting: Rehabilitation

## 2017-10-29 ENCOUNTER — Ambulatory Visit: Payer: Medicare HMO | Admitting: Physical Therapy

## 2018-06-14 ENCOUNTER — Encounter: Payer: Self-pay | Admitting: Rehabilitation

## 2018-06-14 NOTE — Therapy (Signed)
[facility] [address] [place], [place], [address] Phone: [phone]   Fax:  [phone]  Patient Details  Name: Taleeyah Bora MRN: [id number] Date of Birth: [birth year] Referring Provider:  No ref. provider found  Encounter Date: 06/14/2018   PHYSICAL THERAPY DISCHARGE SUMMARY  Visits from Start of Care: 20  Current functional level related to goals / functional outcomes: Unsure as pt did not return for remaining visits.    Remaining deficits: PT Long Term Goals - 08/30/17 1118      PT LONG TERM GOAL #1   Title  Pt will demonstrate independence with LE strengthening and balance HEP    Time  8    Period  Weeks    Status  On-going    Target Date  10/30/17      PT LONG TERM GOAL #2   Title  If approved for home Bioness unit; pt will demonstrate independence with donning, care, skin check and wear schedule    Time  8    Period  Weeks    Status  On-going    Target Date  10/30/17      PT LONG TERM GOAL #3   Title  Pt will improve gait velocity to >/= 2.0 ft/sec with Bioness but no cane to improve safety as community ambulator    Time  8    Period  Weeks    Status  Revised    Target Date  10/30/17      PT LONG TERM GOAL #4   Title  Pt will improve DGI by 4 points (with Bioness, no Cane) to decrease falls risk    Baseline  TBD without Cane    Time  8    Period  Weeks    Status  Revised    Target Date  10/30/17      PT LONG TERM GOAL #5   Title  Pt will improve BERG score to >/ = 53/56 to decrease falls risk    Time  8    Period  Weeks    Status  Revised    Target Date  10/30/17      PT LONG TERM GOAL #6   Title  WITH BIONESS donned:  ambulate >230' over indoor surfaces without cane; 200' over outdoor paved surfaces (with cane); negotiate 4 stairs with one rail, alternating sequence, NO cane at MOD I with improved RLE mechanics    Time  8    Period  Weeks    Status  Revised    Target Date   10/30/17         Education / Equipment: HEP, education to get foot up brace   Plan: Patient agrees to discharge.  Patient goals were not met. Patient is being discharged due to not returning since the last visit.  ?????    Cameron Sprang, PT, MPT [facility] [address] [place], [place], [address] Phone: [phone]   Fax:  [phone] 06/14/18, 9:47 AM

## 2018-06-20 IMAGING — CT CT ANGIO NECK
1 of 8 series · 6 of 33 positions shown · IV contrast (OMNI 350)
Comparison: Head CT 07/25/2017

CLINICAL DATA: Right-sided weakness.

EXAM:
CT ANGIOGRAPHY HEAD AND NECK
TECHNIQUE: Multidetector CT imaging of the head and neck was performed using
the standard protocol during bolus administration of intravenous
contrast. Multiplanar CT image reconstructions and MIPs were
obtained to evaluate the vascular anatomy. Carotid stenosis
measurements (when applicable) are obtained utilizing NASCET
criteria, using the distal internal carotid diameter as the
denominator.
CONTRAST:  50mL RKI5I1-FUZ IOPAMIDOL (RKI5I1-FUZ) INJECTION 76%

[Series 7: cta neck axial · axial · 0.42mm/px · z∈[+1079,+1326]mm · 6 of 347 slices shown]
[im 50/347  soft-tissue]
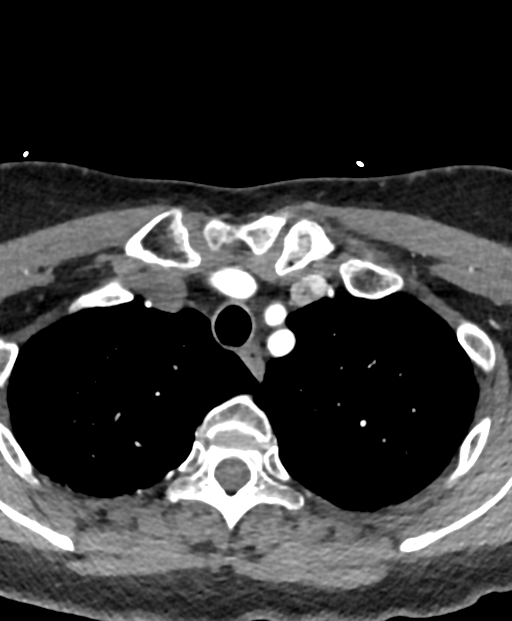
[im 99/347  bone]
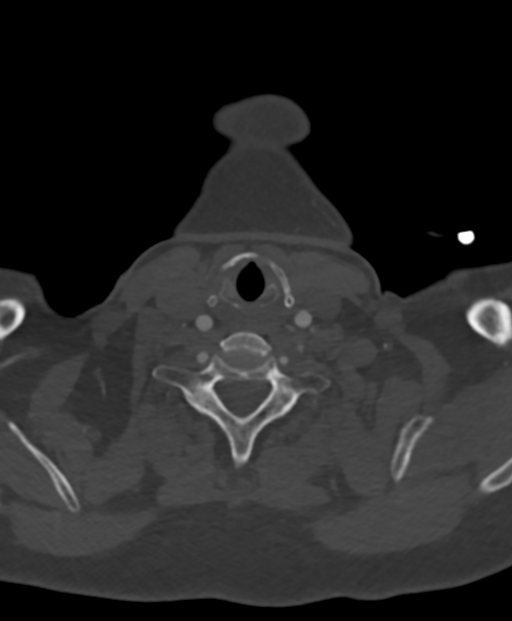
[im 149/347  soft-tissue]
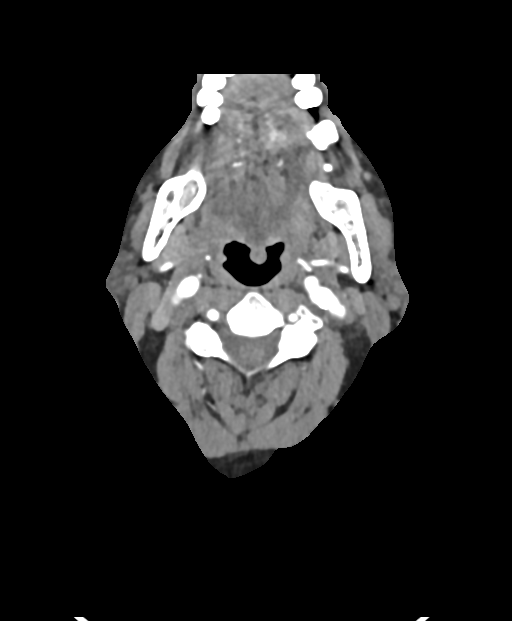
[im 198/347  bone]
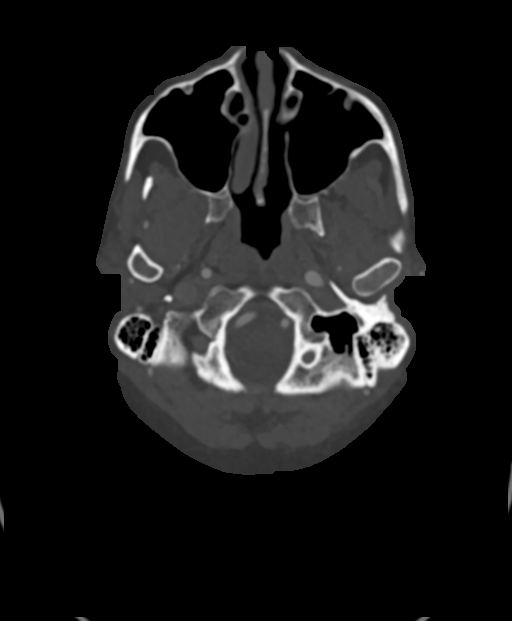
[im 248/347  soft-tissue]
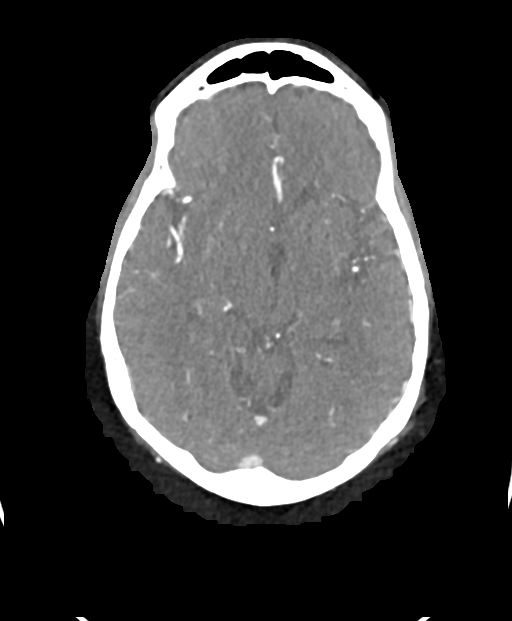
[im 297/347  bone]
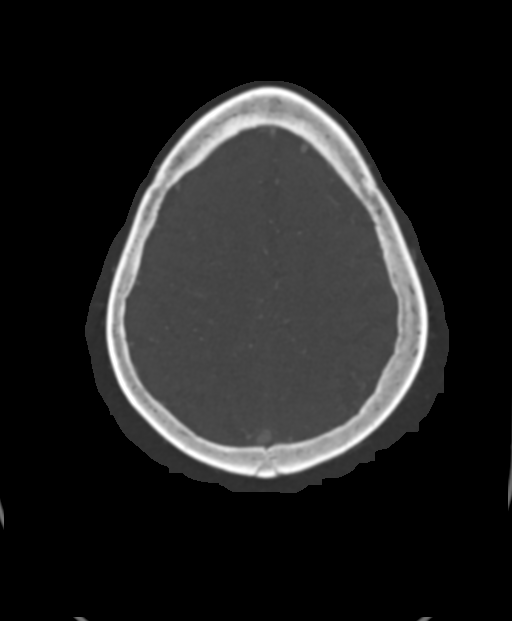

[6 of 33 positions shown; findings below may reference images not displayed]

FINDINGS: CTA NECK FINDINGS

Aortic arch: There is mild calcific atherosclerosis of the aortic
arch. There is no aneurysm, dissection or hemodynamically
significant stenosis of the visualized ascending aorta and aortic
arch. Conventional 3 vessel aortic branching pattern. The visualized
proximal subclavian arteries are widely patent.

Right carotid system:

--Common carotid artery: Widely patent origin without common carotid
artery dissection or aneurysm.

--Internal carotid artery: No dissection, occlusion or aneurysm. No
hemodynamically significant stenosis.

--External carotid artery: No acute abnormality.

Left carotid system:

--Common carotid artery: Widely patent origin without common carotid
artery dissection or aneurysm.

--Internal carotid artery:No dissection, occlusion or aneurysm. No
hemodynamically significant stenosis.

--External carotid artery: No acute abnormality.

Vertebral arteries: Right dominant configuration. Both origins are
normal. No dissection, occlusion or flow-limiting stenosis to the
vertebrobasilar confluence.

Skeleton: There is no bony spinal canal stenosis. No lytic or
blastic lesion.

Other neck: 11 mm left thyroid nodule.

Upper chest: Biapical bullae.

CTA HEAD FINDINGS

Anterior circulation:

--Intracranial internal carotid arteries: Atherosclerotic
calcification of the internal carotid arteries at the skull base
without hemodynamically significant stenosis.

--Anterior cerebral arteries: Normal. Absent right A1 segment,
normal variant

--Middle cerebral arteries: Normal.

--Posterior communicating arteries: Absent bilaterally.

Posterior circulation:

--Basilar artery: Normal.

--Posterior cerebral arteries: Normal.

--Superior cerebellar arteries: Normal.

--Inferior cerebellar arteries: Normal anterior and posterior
inferior cerebellar arteries.

Venous sinuses: As permitted by contrast timing, patent.

Anatomic variants: Congenitally absent right anterior cerebral
artery A1 segment.

Delayed phase: No parenchymal contrast enhancement.

Review of the MIP images confirms the above findings.
IMPRESSION: No emergent large vessel occlusion or hemodynamically significant
stenosis of the arteries of the head and neck.

## 2023-12-25 ENCOUNTER — Other Ambulatory Visit: Payer: Self-pay

## 2023-12-25 ENCOUNTER — Encounter (HOSPITAL_BASED_OUTPATIENT_CLINIC_OR_DEPARTMENT_OTHER): Payer: Self-pay | Admitting: Emergency Medicine

## 2023-12-25 ENCOUNTER — Emergency Department (HOSPITAL_BASED_OUTPATIENT_CLINIC_OR_DEPARTMENT_OTHER): Admission: EM | Admit: 2023-12-25 | Discharge: 2023-12-25 | Disposition: A | Discharge status: Home / Self Care

## 2023-12-25 ENCOUNTER — Emergency Department (HOSPITAL_BASED_OUTPATIENT_CLINIC_OR_DEPARTMENT_OTHER)

## 2023-12-25 DIAGNOSIS — R519 Headache, unspecified: Secondary | ICD-10-CM | POA: Diagnosis present

## 2023-12-25 DIAGNOSIS — Z7902 Long term (current) use of antithrombotics/antiplatelets: Secondary | ICD-10-CM | POA: Diagnosis not present

## 2023-12-25 DIAGNOSIS — Z7982 Long term (current) use of aspirin: Secondary | ICD-10-CM | POA: Diagnosis not present

## 2023-12-25 MED ORDER — ASPIRIN-ACETAMINOPHEN-CAFFEINE 250-250-65 MG PO TABS
1.0000 | ORAL_TABLET | Freq: Once | ORAL | Status: AC
  Administered 2023-12-25: 1 via ORAL
  Filled 2023-12-25: qty 1

## 2023-12-25 MED ORDER — HYDROMORPHONE HCL 1 MG/ML IJ SOLN
0.5000 mg | Freq: Once | INTRAMUSCULAR | Status: AC
  Administered 2023-12-25: 0.5 mg via INTRAMUSCULAR
  Filled 2023-12-25: qty 1

## 2023-12-25 MED ORDER — BUTALBITAL-APAP-CAFFEINE 50-325-40 MG PO TABS: 1.0000 | ORAL_TABLET | Freq: Three times a day (TID) | ORAL | 0 refills | Status: AC | PRN

## 2023-12-25 NOTE — ED Notes (Signed)
 Patient transported to CT

## 2023-12-25 NOTE — ED Provider Notes (Signed)
 [facility] Provider Note   CSN: [id number] Arrival date & time: 12/25/23  1725     Patient presents with: Headache   Kristin Mahoney is a 68 y.o. female.   68 year old female presents for evaluation of headache.  She states she has had 1 for 2 days and nothing over-the-counter has been helping.  States it occurred after she got routine knee injection.  States she has never had a headache after 1 before.  Patient denies any vision changes, numbness or tingling, nausea, or any other symptoms or concerns at this time.   Headache Associated symptoms: no abdominal pain, no back pain, no cough, no ear pain, no eye pain, no fever, no seizures, no sore throat and no vomiting        Prior to Admission medications   Medication Sig Start Date End Date Taking? Authorizing Provider  butalbital -acetaminophen -caffeine  (FIORICET) 50-325-40 MG tablet Take 1-2 tablets by mouth every 8 (eight) hours as needed for up to 4 days for headache. 12/25/23 12/29/23 Yes Astin Sayre L, DO  amLODipine  (NORVASC ) 5 MG tablet Take 5 mg by mouth daily. 06/17/15   [provider]  aspirin  81 MG chewable tablet Chew 81 mg by mouth daily.    [provider]  atorvastatin  (LIPITOR) 20 MG tablet Take 20 mg by mouth daily. 07/05/15   [provider]  baclofen  (LIORESAL ) 10 MG tablet Take 5 mg by mouth 2 (two) times daily. Reported on 07/17/2015 05/10/15   [provider]  budesonide-formoterol (SYMBICORT) 160-4.5 MCG/ACT inhaler Inhale 1 puff into the lungs 2 (two) times daily as needed for shortness of breath. 04/28/16   [provider]  Cholecalciferol  (VITAMIN D  PO) Take 1 tablet by mouth daily.     [provider]  clopidogrel  (PLAVIX ) 75 MG tablet Take 75 mg by mouth daily. 06/27/15   [provider]  HYDROcodone -acetaminophen  (NORCO) 10-325 MG tablet Take 1 tablet by mouth every 6 (six) hours as needed.     [provider]  levothyroxine  (SYNTHROID , LEVOTHROID) 125 MCG tablet Take 125 mcg by mouth daily. 06/19/15   [provider]  lisinopril  (PRINIVIL ,ZESTRIL ) 10 MG tablet Take 10 mg by mouth daily. 04/19/15   [provider]  Magnesium  250 MG TABS Take 250 mg by mouth daily.     [provider]  Multiple Vitamins-Minerals (CENTRUM SILVER  ADULT 50+ PO) Take 1 tablet by mouth daily.    [provider]  predniSONE  (DELTASONE ) 5 MG tablet Take 5 mg by mouth 2 (two) times daily with a meal.    [provider]    Allergies: Ciprofloxacin    Review of Systems  Constitutional:  Negative for chills and fever.  HENT:  Negative for ear pain and sore throat.   Eyes:  Negative for pain and visual disturbance.  Respiratory:  Negative for cough and shortness of breath.   Cardiovascular:  Negative for chest pain and palpitations.  Gastrointestinal:  Negative for abdominal pain and vomiting.  Genitourinary:  Negative for dysuria and hematuria.  Musculoskeletal:  Negative for arthralgias and back pain.  Skin:  Negative for color change and rash.  Neurological:  Positive for headaches. Negative for seizures and syncope.  All other systems reviewed and are negative.   Updated Vital Signs BP (!) 150/94   Pulse 65   Temp 98.9 F (37.2 C) (Oral)   Resp 18   Ht 5' 6 (1.676 m)   Wt 70.3 kg  SpO2 98%   BMI 25.02 kg/m   Physical Exam Vitals and nursing note reviewed.  Constitutional:      General: She is not in acute distress.    Appearance: She is well-developed.  HENT:     Head: Normocephalic and atraumatic.  Eyes:     Conjunctiva/sclera: Conjunctivae normal.  Cardiovascular:     Rate and Rhythm: Normal rate and regular rhythm.     Heart sounds: No murmur heard. Pulmonary:     Effort: Pulmonary effort is normal. No respiratory distress.     Breath sounds: Normal breath sounds.  Abdominal:     Palpations: Abdomen is soft.      Tenderness: There is no abdominal tenderness.  Musculoskeletal:        General: No swelling.     Cervical back: Neck supple.  Skin:    General: Skin is warm and dry.     Capillary Refill: Capillary refill takes less than 2 seconds.  Neurological:     Mental Status: She is alert. Mental status is at baseline.     Cranial Nerves: No cranial nerve deficit.  Psychiatric:        Mood and Affect: Mood normal.     (all labs ordered are listed, but only abnormal results are displayed) Labs Reviewed - No data to display  EKG: None  Radiology: CT Head Wo Contrast Result Date: 12/25/2023 CLINICAL DATA:  headache Pt c/o HA since yesterday s/p receiving injection in RT knee; sts OTC meds are not helping. Prior stroke EXAM: CT HEAD WITHOUT CONTRAST TECHNIQUE: Contiguous axial images were obtained from the base of the skull through the vertex without intravenous contrast. RADIATION DOSE REDUCTION: This exam was performed according to the departmental dose-optimization program which includes automated exposure control, adjustment of the mA and/or kV according to patient size and/or use of iterative reconstruction technique. COMPARISON:  MRI head 07/26/2017, CT head 10/24/2022 FINDINGS: Brain: Patchy and confluent areas of decreased attenuation are noted throughout the deep and periventricular white matter of the cerebral hemispheres bilaterally, compatible with chronic microvascular ischemic disease. Chronic left corona radiata infarction. No evidence of large-territorial acute infarction. No parenchymal hemorrhage. No mass lesion. No extra-axial collection. No mass effect or midline shift. No hydrocephalus. Basilar cisterns are patent. Vascular: No hyperdense vessel. Atherosclerotic calcifications are present within the cavernous internal carotid arteries. Skull: No acute fracture or focal lesion. Sinuses/Orbits: Paranasal sinuses and mastoid air cells are clear. The orbits are unremarkable. Other: None.  IMPRESSION: No acute intracranial abnormality. Electronically Signed   By: Morgane  Naveau M.D.   On: 12/25/2023 19:08     Procedures   Medications Ordered in the ED  aspirin -acetaminophen -caffeine  (EXCEDRIN  MIGRAINE) per tablet 1 tablet (1 tablet Oral Given 12/25/23 1836)  HYDROmorphone  (DILAUDID ) injection 0.5 mg (0.5 mg Intramuscular Given 12/25/23 1837)                                    Medical Decision Making Patient here for headache.  Slightly improved after Fioricet and Dilaudid .  CT head is negative.  Advise close follow-up with her primary care doctor and I will give her prescription for Fioricet to use as needed.  Advised return for new or worsening symptoms.  Feels comfortable with plan be discharged home.  Problems Addressed: Acute nonintractable headache, unspecified headache type: acute illness or injury  Amount and/or Complexity of Data Reviewed External Data Reviewed: notes.  Details: Outpatient records reviewed and patient had a recent knee injection for arthritis/chronic pain Radiology: ordered and independent interpretation performed. Decision-making details documented in ED Course.    Details: Ordered and interpreted by me independently of radiology CT head: Shows no acute abnormality  Risk OTC drugs. Prescription drug management. Parenteral controlled substances.     Final diagnoses:  Acute nonintractable headache, unspecified headache type    ED Discharge Orders          Ordered    butalbital -acetaminophen -caffeine  (FIORICET) 50-325-40 MG tablet  Every 8 hours PRN        12/25/23 1921               Rasheen Bells L, DO 12/25/23 2107

## 2023-12-25 NOTE — Discharge Instructions (Addendum)
 You can use your Fioricet or Tylenol  as needed for pain.  Follow-up with your primary care doctor.  Return to the ER for new or worsening symptoms.

## 2023-12-25 NOTE — ED Notes (Signed)
 Pt complained of an intractable headache that has not gotten better, hx of migraines. No nausea but lying in bed with light sensitivity.

## 2023-12-25 NOTE — ED Triage Notes (Signed)
 Pt c/o HA since yesterday s/p receiving injection in RT knee; sts OTC meds are not helping
